# Patient Record
Sex: Female | Born: 1935 | Race: White | Hispanic: No | Marital: Married | Smoking: Never smoker
Health system: Southern US, Community
[De-identification: ages and names within clinical notes are randomized; demographics above are authoritative.]

## PROBLEM LIST (undated history)

## (undated) DIAGNOSIS — C50919 Malignant neoplasm of unspecified site of unspecified female breast: Secondary | ICD-10-CM

## (undated) DIAGNOSIS — D649 Anemia, unspecified: Secondary | ICD-10-CM

## (undated) DIAGNOSIS — Z923 Personal history of irradiation: Secondary | ICD-10-CM

## (undated) DIAGNOSIS — M1990 Unspecified osteoarthritis, unspecified site: Secondary | ICD-10-CM

## (undated) DIAGNOSIS — C4491 Basal cell carcinoma of skin, unspecified: Secondary | ICD-10-CM

## (undated) DIAGNOSIS — E785 Hyperlipidemia, unspecified: Secondary | ICD-10-CM

## (undated) DIAGNOSIS — D0510 Intraductal carcinoma in situ of unspecified breast: Secondary | ICD-10-CM

## (undated) DIAGNOSIS — K900 Celiac disease: Secondary | ICD-10-CM

## (undated) HISTORY — DX: Celiac disease: K90.0

## (undated) HISTORY — PX: TUBAL LIGATION: SHX77

## (undated) HISTORY — DX: Hyperlipidemia, unspecified: E78.5

## (undated) HISTORY — PX: BREAST EXCISIONAL BIOPSY: SUR124

## (undated) HISTORY — DX: Basal cell carcinoma of skin, unspecified: C44.91

## (undated) HISTORY — DX: Intraductal carcinoma in situ of unspecified breast: D05.10

---
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
## 2015-10-10 DIAGNOSIS — E782 Mixed hyperlipidemia: Secondary | ICD-10-CM | POA: Insufficient documentation

## 2015-10-10 DIAGNOSIS — K900 Celiac disease: Secondary | ICD-10-CM | POA: Insufficient documentation

## 2016-06-15 ENCOUNTER — Other Ambulatory Visit: Payer: Self-pay | Admitting: Internal Medicine

## 2016-06-15 DIAGNOSIS — Z1231 Encounter for screening mammogram for malignant neoplasm of breast: Secondary | ICD-10-CM

## 2016-06-24 ENCOUNTER — Ambulatory Visit
Admission: RE | Admit: 2016-06-24 | Discharge: 2016-06-24 | Disposition: A | Discharge status: Home / Self Care | Payer: Medicare PPO | Source: Ambulatory Visit | Attending: Internal Medicine | Admitting: Internal Medicine

## 2016-06-24 ENCOUNTER — Encounter: Payer: Self-pay | Admitting: Radiology

## 2016-06-24 DIAGNOSIS — Z1231 Encounter for screening mammogram for malignant neoplasm of breast: Secondary | ICD-10-CM | POA: Insufficient documentation

## 2016-06-29 ENCOUNTER — Other Ambulatory Visit: Payer: Self-pay | Admitting: *Deleted

## 2016-06-29 ENCOUNTER — Inpatient Hospital Stay
Admission: RE | Admit: 2016-06-29 | Discharge: 2016-06-29 | Disposition: A | Discharge status: Home / Self Care | Payer: Self-pay | Source: Ambulatory Visit | Attending: *Deleted | Admitting: *Deleted

## 2016-06-29 DIAGNOSIS — Z9289 Personal history of other medical treatment: Secondary | ICD-10-CM

## 2016-10-11 DIAGNOSIS — Z8249 Family history of ischemic heart disease and other diseases of the circulatory system: Secondary | ICD-10-CM | POA: Insufficient documentation

## 2016-10-11 DIAGNOSIS — E538 Deficiency of other specified B group vitamins: Secondary | ICD-10-CM | POA: Insufficient documentation

## 2017-04-05 DIAGNOSIS — C50919 Malignant neoplasm of unspecified site of unspecified female breast: Secondary | ICD-10-CM

## 2017-04-05 HISTORY — DX: Malignant neoplasm of unspecified site of unspecified female breast: C50.919

## 2017-05-17 ENCOUNTER — Other Ambulatory Visit: Payer: Self-pay | Admitting: Internal Medicine

## 2017-05-17 DIAGNOSIS — Z1231 Encounter for screening mammogram for malignant neoplasm of breast: Secondary | ICD-10-CM

## 2017-06-27 ENCOUNTER — Ambulatory Visit
Admission: RE | Admit: 2017-06-27 | Discharge: 2017-06-27 | Disposition: A | Discharge status: Home / Self Care | Payer: Medicare PPO | Source: Ambulatory Visit | Attending: Internal Medicine | Admitting: Internal Medicine

## 2017-06-27 DIAGNOSIS — R928 Other abnormal and inconclusive findings on diagnostic imaging of breast: Secondary | ICD-10-CM | POA: Insufficient documentation

## 2017-06-27 DIAGNOSIS — Z1231 Encounter for screening mammogram for malignant neoplasm of breast: Secondary | ICD-10-CM | POA: Insufficient documentation

## 2017-06-29 ENCOUNTER — Other Ambulatory Visit: Payer: Self-pay | Admitting: Internal Medicine

## 2017-06-29 DIAGNOSIS — R928 Other abnormal and inconclusive findings on diagnostic imaging of breast: Secondary | ICD-10-CM

## 2017-06-29 DIAGNOSIS — R921 Mammographic calcification found on diagnostic imaging of breast: Secondary | ICD-10-CM

## 2017-07-11 ENCOUNTER — Ambulatory Visit
Admission: RE | Admit: 2017-07-11 | Discharge: 2017-07-11 | Disposition: A | Discharge status: Home / Self Care | Payer: Medicare PPO | Source: Ambulatory Visit | Attending: Internal Medicine | Admitting: Internal Medicine

## 2017-07-11 DIAGNOSIS — R928 Other abnormal and inconclusive findings on diagnostic imaging of breast: Secondary | ICD-10-CM

## 2017-07-11 DIAGNOSIS — R921 Mammographic calcification found on diagnostic imaging of breast: Secondary | ICD-10-CM

## 2017-12-19 ENCOUNTER — Other Ambulatory Visit: Payer: Self-pay | Admitting: Internal Medicine

## 2017-12-19 DIAGNOSIS — R921 Mammographic calcification found on diagnostic imaging of breast: Secondary | ICD-10-CM

## 2018-01-13 ENCOUNTER — Ambulatory Visit
Admission: RE | Admit: 2018-01-13 | Discharge: 2018-01-13 | Disposition: A | Discharge status: Home / Self Care | Payer: Medicare PPO | Source: Ambulatory Visit | Attending: Internal Medicine | Admitting: Internal Medicine

## 2018-01-13 DIAGNOSIS — R921 Mammographic calcification found on diagnostic imaging of breast: Secondary | ICD-10-CM

## 2018-01-17 ENCOUNTER — Other Ambulatory Visit: Payer: Self-pay | Admitting: Internal Medicine

## 2018-01-17 DIAGNOSIS — R921 Mammographic calcification found on diagnostic imaging of breast: Secondary | ICD-10-CM

## 2018-01-17 DIAGNOSIS — R928 Other abnormal and inconclusive findings on diagnostic imaging of breast: Secondary | ICD-10-CM

## 2018-01-23 ENCOUNTER — Ambulatory Visit
Admission: RE | Admit: 2018-01-23 | Discharge: 2018-01-23 | Disposition: A | Discharge status: Home / Self Care | Payer: Medicare PPO | Source: Ambulatory Visit | Attending: Internal Medicine | Admitting: Internal Medicine

## 2018-01-23 DIAGNOSIS — R928 Other abnormal and inconclusive findings on diagnostic imaging of breast: Secondary | ICD-10-CM

## 2018-01-23 DIAGNOSIS — D0510 Intraductal carcinoma in situ of unspecified breast: Secondary | ICD-10-CM

## 2018-01-23 DIAGNOSIS — R921 Mammographic calcification found on diagnostic imaging of breast: Secondary | ICD-10-CM

## 2018-01-23 HISTORY — PX: BREAST BIOPSY: SHX20

## 2018-01-23 HISTORY — DX: Intraductal carcinoma in situ of unspecified breast: D05.10

## 2018-01-26 ENCOUNTER — Other Ambulatory Visit: Payer: Self-pay | Admitting: *Deleted

## 2018-01-26 DIAGNOSIS — D0511 Intraductal carcinoma in situ of right breast: Secondary | ICD-10-CM | POA: Insufficient documentation

## 2018-01-26 LAB — SURGICAL PATHOLOGY

## 2018-02-01 ENCOUNTER — Inpatient Hospital Stay: Payer: Medicare PPO | Attending: Oncology | Admitting: Oncology

## 2018-02-01 ENCOUNTER — Encounter: Payer: Self-pay | Admitting: Oncology

## 2018-02-01 ENCOUNTER — Other Ambulatory Visit: Payer: Self-pay

## 2018-02-01 ENCOUNTER — Encounter: Payer: Self-pay | Admitting: *Deleted

## 2018-02-01 VITALS — BP 123/69 | HR 76 | Temp 98.1°F | Resp 18 | Ht 64.5 in | Wt 126.6 lb

## 2018-02-01 DIAGNOSIS — E785 Hyperlipidemia, unspecified: Secondary | ICD-10-CM | POA: Diagnosis not present

## 2018-02-01 DIAGNOSIS — Z79899 Other long term (current) drug therapy: Secondary | ICD-10-CM | POA: Diagnosis not present

## 2018-02-01 DIAGNOSIS — K900 Celiac disease: Secondary | ICD-10-CM | POA: Diagnosis not present

## 2018-02-01 DIAGNOSIS — D0511 Intraductal carcinoma in situ of right breast: Secondary | ICD-10-CM | POA: Diagnosis not present

## 2018-02-01 NOTE — Progress Notes (Signed)
Patient here for initial visit. °

## 2018-02-02 ENCOUNTER — Encounter: Payer: Self-pay | Admitting: *Deleted

## 2018-02-03 NOTE — Progress Notes (Signed)
  Oncology Nurse Navigator Documentation  Navigator Location: [facility]Med [facility] (02/03/18 1200) Referral date to RadOnc/MedOnc: 02/02/18 (02/03/18 1200) )Navigator Encounter Type: Initial MedOnc (02/03/18 1200)   Abnormal Finding Date: 01/13/18 (02/03/18 1200) Confirmed Diagnosis Date: 01/25/18 (02/03/18 1200)               Patient Visit Type: MedOnc (02/03/18 1200) Treatment Phase: Pre-Tx/Tx Discussion (02/03/18 1200) Barriers/Navigation Needs: Education (02/03/18 1200) Education: Understanding Cancer/ Treatment Options;Newly Diagnosed Cancer Education (02/03/18 1200) Interventions: Education (02/03/18 1200)        Support Groups/Services: Breast Support Group (02/03/18 1200)             Time Spent with Patient: 30 (02/03/18 1200)   Met with patient and her husband during her initial medical oncology consult with Dr. Tasia Catchings.  Patient has educational literature, "My Breast Cancer Treatment Handbook" by Josephine Igo, RN that she received at her surgical consult.  She is a retired Marine scientist.  Dr. Tasia Catchings discussed participation in the COMET trial.  Patient wants to discuss with Dr. Bary Castilla.  Will follow-up per [facility] protocol.

## 2018-02-05 NOTE — Progress Notes (Signed)
Hematology/Oncology Consult note [facility] Telephone:([phone] [phone]   Patient Care Team: Rusty Aus, MD as PCP - General (Internal Medicine)  REFERRING PROVIDER: Rusty Aus, MD CHIEF COMPLAINTS/REASON FOR VISIT:  Evaluation of breast cancer  HISTORY OF PRESENTING ILLNESS:  Emma Aguirre is a  82 y.o.  female with PMH listed below who was referred to me for evaluation of DCIS.  Patient is a retired Therapist, sports.  Patient had diagnostic mammogram right, on 01/13/2018 to follow up right upper quadrant calcifications.  Mammogram showed right upper quadrant 5m group of calcification, for which stereotactic core needle biopsy was performed.   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    Family history: denies any breast cancer or ovarian cancer OCP use: report remote use of OCP Estrogen and progesterone therapy: endorses history of hormone replacement therapy History of radiation to chest: denies.  Denies any breast pain, nipple discharge, breast skin changes.   Review of Systems  Constitutional: Negative for chills, fever, malaise/fatigue and weight loss.  HENT: Negative for nosebleeds and sore throat.   Eyes: Negative for double vision, photophobia and redness.  Respiratory: Negative for cough, shortness of breath and wheezing.   Cardiovascular: Negative for chest pain, palpitations and orthopnea.  Gastrointestinal: Negative for abdominal pain, blood in stool, nausea and vomiting.  Genitourinary: Negative for dysuria.  Musculoskeletal: Negative for back pain, myalgias and neck pain.  Skin: Negative for itching and rash.  Neurological: Negative for dizziness, tingling and tremors.  Endo/Heme/Allergies: Negative for environmental allergies. Does not bruise/bleed easily.  Psychiatric/Behavioral: Negative for depression.    MEDICAL HISTORY:  Past Medical  History:  Diagnosis Date  . Celiac disease   . Hyperlipidemia     SURGICAL HISTORY: Past Surgical History:  Procedure Laterality Date  . BREAST BIOPSY Right 01/23/2018   stereo calcs path pending x clip  . TUBAL LIGATION      SOCIAL HISTORY: Social History   Socioeconomic History  . Marital status: Married    Spouse name: Not on file  . Number of children: Not on file  . Years of education: Not on file  . Highest education level: Not on file  Occupational History  . Not on file  Social Needs  . Financial resource strain: Not on file  . Food insecurity:    Worry: Not on file    Inability: Not on file  . Transportation needs:    Medical: Not on file    Non-medical: Not on file  Tobacco Use  . Smoking status: Never Smoker  . Smokeless tobacco: Never Used  Substance and Sexual Activity  . Alcohol use: Never    Frequency: Never  . Drug use: Never  . Sexual activity: Not on file  Lifestyle  . Physical activity:    Days per week: Not on file    Minutes per session: Not on file  . Stress: Not on file  Relationships  . Social connections:    Talks on phone: Not on file    Gets together: Not on file    Attends religious service: Not on file    Active member of club or organization: Not on file    Attends meetings of clubs or organizations: Not on file    Relationship status: Not on file  . Intimate partner violence:    Fear of current or ex partner: Not on file    Emotionally abused: Not on file  Physically abused: Not on file    Forced sexual activity: Not on file  Other Topics Concern  . Not on file  Social History Narrative  . Not on file    FAMILY HISTORY: Family History  Problem Relation Age of Onset  . Non-Hodgkin's lymphoma Mother   . Heart attack Father   . Hypertension Brother   . Breast cancer Neg Hx     ALLERGIES:  has No Known Allergies.  MEDICATIONS:  Current Outpatient Medications  Medication Sig Dispense Refill  . Biotin 5 MG TBDP  Take 5,000 mcg by mouth daily.    . Calcium Carb-Cholecalciferol (CALCIUM-VITAMIN D) 600-400 MG-UNIT TABS Take 1 tablet by mouth daily.    . Cholecalciferol (VITAMIN D-1000 MAX ST) 1000 units tablet Take 1,000 Units by mouth daily.    . Magnesium 250 MG TABS Take 250 mg by mouth daily.    . simvastatin (ZOCOR) 40 MG tablet Take 40 mg by mouth daily.    . vitamin B-12 (CYANOCOBALAMIN) 500 MCG tablet Take 500 mcg by mouth daily.    . vitamin C (ASCORBIC ACID) 250 MG tablet Take 250 mg by mouth daily.     No current facility-administered medications for this visit.      PHYSICAL EXAMINATION: ECOG PERFORMANCE STATUS: 0 - Asymptomatic Vitals:   02/01/18 1512  BP: 123/69  Pulse: 76  Resp: 18  Temp: 98.1 F (36.7 C)   Filed Weights   02/01/18 1512  Weight: 126 lb 9.6 oz (57.4 kg)    Physical Exam  Constitutional: She is oriented to person, place, and time. No distress.  HENT:  Head: Normocephalic and atraumatic.  Mouth/Throat: Oropharynx is clear and moist.  Eyes: Pupils are equal, round, and reactive to light. EOM are normal. No scleral icterus.  Neck: Normal range of motion. Neck supple.  Cardiovascular: Normal rate, regular rhythm and normal heart sounds.  Pulmonary/Chest: Effort normal. No respiratory distress. She has no wheezes.  Abdominal: Soft. Bowel sounds are normal. She exhibits no distension and no mass. There is no tenderness.  Musculoskeletal: Normal range of motion. She exhibits no edema or deformity.  Neurological: She is alert and oriented to person, place, and time. No cranial nerve deficit. Coordination normal.  Skin: Skin is warm and dry. No rash noted. No erythema.  Psychiatric: She has a normal mood and affect. Her behavior is normal. Thought content normal.  Breast Exam: no palpable mass bilateral breast. Mild biopsy tissue swelling.   LABORATORY DATA:  I have reviewed the data as listed No results found for: WBC, HGB, HCT, MCV, PLT No results for  input(s): NA, K, CL, CO2, GLUCOSE, BUN, CREATININE, CALCIUM, GFRNONAA, GFRAA, PROT, ALBUMIN, AST, ALT, ALKPHOS, BILITOT, BILIDIR, IBILI in the last 8760 hours. Iron/TIBC/Ferritin/ %Sat No results found for: IRON, TIBC, FERRITIN, IRONPCTSAT   Reviewed patient's lab done at [facility] clinic on 10/07/2017  Cbc showed hemoglobin 12.6, wbc 6.2, platelet 315 CMP showed sodium 142, potassium 4.5, creatinine 0.8, calcium 9.6, AST 20, ALT 16, bilirubin 0.4  ASSESSMENT & PLAN:  1. Ductal carcinoma in situ (DCIS) of right breast    The DCIS diagnosis and care plan were discussed with patient in detail.  We discussed that DCIS is a non invasive breast cancer,  cancer is only in the duct and has not spread into the tissues around it. A substantial number of DCIS lesions will never form a health hazard, particularly if it concerns slow-growing low-risk DCIS (grade I and II). This implies that  many women might be unnecessarily going through intensive treatment resulting in a decrease in quality of life and an increase in health care costs, without any survival benefit. Discussed about COMET trial which is designed to explore the management of low-risk DCIS using an active surveillance (AS) approach  Patient will have 50% chance being radomized to surveillance approach arm or standard management arm.  Her case was discussed on breast tumor board and consensus is reached that she is a candidate for COMET trial.  Patient appreciate discussion and would like to talk to research RN.  She will consider it and further discuss with Dr.Byrnett.   We spent sufficient time to discuss many aspect of care, questions were answered to patient's satisfaction.   All questions were answered. The patient knows to call the clinic with any problems questions or concerns.  Return of visit: to be determined based on patient's decision of trial enrollment and also randomization. Breast Cancer RN Navigator Sheena to coordinate.    Thank you for this kind referral and the opportunity to participate in the care of this patient. A copy of today's note is routed to referring provider  Total face to face encounter time for this patient visit was 70 min. >50% of the time was  spent in counseling and coordination of care.    Earlie Server, MD, PhD Hematology Oncology [facility] at [facility] Pager- [phone] 02/05/2018

## 2018-02-16 ENCOUNTER — Encounter: Payer: Self-pay | Admitting: General Surgery

## 2018-02-16 ENCOUNTER — Other Ambulatory Visit: Payer: Self-pay

## 2018-02-16 ENCOUNTER — Ambulatory Visit: Payer: Medicare PPO | Admitting: General Surgery

## 2018-02-16 ENCOUNTER — Ambulatory Visit: Payer: Self-pay

## 2018-02-16 VITALS — BP 131/79 | HR 66 | Temp 97.7°F | Resp 18 | Ht 64.5 in | Wt 127.0 lb

## 2018-02-16 DIAGNOSIS — D0511 Intraductal carcinoma in situ of right breast: Secondary | ICD-10-CM

## 2018-02-16 NOTE — Progress Notes (Signed)
Patient ID: Emma Aguirre, female   DOB: 04-27-1935, 82 y.o.   MRN: [id number]  Chief Complaint  Patient presents with  . Breast Problem    HPI Emma Aguirre is a 82 y.o. female.  who presents for a breast evaluation referred by Dr Emily Filbert. The most recent mammogram and right breast biopsy was done on 01-23-18, showing DCIS.  She could not feel anything in the breast prior to the mammogram. Patient does perform regular self breast checks and gets regular mammograms done.   She had an appointment with Dr Tasia Catchings 02-01-18, she qualifies for the COMET study. She is here with her husband, Gene. She is retired Equities trader.  HPI  Past Medical History:  Diagnosis Date  . Celiac disease   . DCIS (ductal carcinoma in situ) 01/23/2018   right breast DUCTAL CARCINOMA IN SITU, INTERMEDIATE GRADE, WITHOUT COMEDONECROSIS  . Hyperlipidemia     Past Surgical History:  Procedure Laterality Date  . BREAST BIOPSY Right 01/23/2018   stereo calcs  x clip, DUCTAL CARCINOMA IN SITU, INTERMEDIATE GRADE, WITHOUT COMEDONECROSIS  . TUBAL LIGATION      Family History  Problem Relation Age of Onset  . Non-Hodgkin's lymphoma Mother   . Heart attack Father   . Hypertension Brother   . Breast cancer Neg Hx   . Colon cancer Neg Hx     Social History Social History   Tobacco Use  . Smoking status: Never Smoker  . Smokeless tobacco: Never Used  Substance Use Topics  . Alcohol use: Never    Frequency: Never  . Drug use: Never    No Known Allergies  Current Outpatient Medications  Medication Sig Dispense Refill  . Biotin 5 MG TBDP Take 5,000 mcg by mouth daily.    . Calcium Carb-Cholecalciferol (CALCIUM-VITAMIN D) 600-400 MG-UNIT TABS Take 1 tablet by mouth daily.    . Cholecalciferol (VITAMIN D-1000 MAX ST) 1000 units tablet Take 1,000 Units by mouth daily.    . Magnesium 250 MG TABS Take 250 mg by mouth daily.    . naproxen sodium (ALEVE) 220 MG tablet Take 220 mg by mouth 2 (two) times  daily as needed.    . simvastatin (ZOCOR) 40 MG tablet Take 40 mg by mouth daily.    . vitamin B-12 (CYANOCOBALAMIN) 500 MCG tablet Take 500 mcg by mouth daily.    . vitamin C (ASCORBIC ACID) 250 MG tablet Take 250 mg by mouth daily.     No current facility-administered medications for this visit.     Review of Systems Review of Systems  Constitutional: Negative.   Respiratory: Negative.   Cardiovascular: Negative.     Blood pressure 131/79, pulse 66, temperature 97.7 F (36.5 C), temperature source Skin, resp. rate 18, height 5' 4.5" (1.638 m), weight 127 lb (57.6 kg), SpO2 97 %.  Physical Exam Physical Exam  Constitutional: She is oriented to person, place, and time. She appears well-developed and well-nourished.  HENT:  Mouth/Throat: Oropharynx is clear and moist.  Eyes: Conjunctivae are normal. No scleral icterus.  Neck: Neck supple.  Cardiovascular: Normal rate, regular rhythm and normal heart sounds.  Pulmonary/Chest: Effort normal and breath sounds normal. Right breast exhibits no inverted nipple, no mass, no nipple discharge, no skin change and no tenderness. Left breast exhibits no inverted nipple, no mass, no nipple discharge, no skin change and no tenderness.  Lymphadenopathy:    She has no cervical adenopathy.    She has no axillary adenopathy.  Neurological: She  is alert and oriented to person, place, and time.  Skin: Skin is warm and dry.  Psychiatric: Her behavior is normal.    Data Reviewed  Mammograms of the right breast completed on January 13, 2018 were reviewed.  A 6 mm area of microcalcifications were noted.  Bilateral mammograms of July 11, 2017 showed a area of microcalcifications thought to be benign for which a six-month follow-up was recommended.  The left breast was unremarkable.  A. BREAST CALCIFICATIONS, RIGHT UPPER OUTER QUADRANT; STEREOTACTIC  BIOPSY:  - DUCTAL CARCINOMA IN SITU, INTERMEDIATE GRADE, WITHOUT COMEDONECROSIS.  - CALCIFICATIONS  ASSOCIATED WITH DCIS AND BLOOD VESSELS.   Comment:  DCIS is present in 3 of 4 blocks with the largest linear focus measuring  5 mm. Immunohistochemistry for ER and PR will be performed on block A1  and reported in an addendum.   ADDENDUM:  BREAST BIOMARKER TESTS  Estrogen Receptor (ER) Status: POSITIVE    Percentage of cells with nuclear positivity: > 90%    Average intensity of staining: Strong   Progesterone Receptor (PgR) Status: POSITIVE    Percentage of cells with nuclear positivity: 51-90%    Average intensity of staining: Moderate   February 02, 2017 bone density: REFERRING MD:M.F. MILLER  TECHNICIAN:Lori ARobinson HISTORY:BASELINE STUDY. 82 YEAR OLD WHITE FEMALE.  SITE DATE BMD g/cm2  T-SCORE Z-SCORE g/cm2 CHANGE % CHANGESTATISTICALLY SIGNIFICANT?  LUMBAR SPINE L1- L4   HIP L.FEM.NECK 02/01/17 0.632 -2.0     L.TOTAL HIP 02/01/17 0.825 -1.0    FOREARM L/R 33% 02/01/17 0.554 -2.3     INTERPRETATION: Lumbar spine not reported due to scoliosis or sclerosis.   Osteopenia/Low bone mass .   Assessment    Intermediate grade DCIS    Plan    The majority of the 45-minute visit was devoted to review of therapy options.  These include 1) standard treatment with wide excision, postoperative radiation therapy and antiestrogen therapy versus 2) up and coming therapy of wide excision followed by antiestrogen therapy alone based on age versus 3) participation in the Comet trial with potential randomization versus 4) management with antiestrogen therapy alone.  Details of all of the above were reviewed.  #4 would be my at least preferred as it does not advance any knowledge regarding future treatment options for other women.  The patient had previously met with Dr. Tasia Catchings from medical oncology.  The patient is somewhat bit weeks in  between of how she would like to proceed.  She is decided that she is going to defer therapy until after the holidays which I think is very reasonable considering this tumor.  She is been encouraged to call should she have any questions.  We touched briefly on the potential for the institution of antiestrogen therapy.  I think it is unlikely that she will consider radiation treatment, and is not unreasonable to consider an antiestrogen as neoadjuvant hormonal therapy.  As the patient is already osteopenic consideration could be given towards use of tamoxifen.  It does have a 1% risk of DVT.  The patient does have an intact uterus with a low risk, 1%, developing uterine malignancy.  The improved bone structure would certainly be a plus.  Letrozole or another aromatase inhibitor could be administered although it might aggravate her bone loss, albeit slowly.   .        HPI, Physical Exam, Assessment and Plan have been scribed under the direction and in the presence of Robert Bellow, MD. Karie Fetch, RN  I have completed the exam and reviewed the above documentation for accuracy and completeness.  I agree with the above.  Haematologist has been used and any errors in dictation or transcription are unintentional.  Hervey Ard, M.D., F.A.C.S.   Forest Gleason Jamina Macbeth 02/20/2018, 7:56 PM

## 2018-02-16 NOTE — Patient Instructions (Signed)
The patient is aware to call back for any questions or concerns.  

## 2018-02-20 ENCOUNTER — Telehealth: Payer: Self-pay | Admitting: *Deleted

## 2018-02-20 NOTE — Telephone Encounter (Signed)
Patient called and stated that she saw Dr.Byrnett last week and forgot to ask him some questions regarding the hormone therapy medication that she can take until she has surgery in january. She wanted to know the name of medication and will that be safe to take up until the surgery.

## 2018-02-22 NOTE — Telephone Encounter (Signed)
-----   Message from Robert Bellow, MD sent at 02/21/2018  5:52 PM EST ----- Patient seen last week re: DCIS. Had discussed starting anti-estrogen treatment between now and January when she may have surgery. Would defer starting any medication at this time.  No risk with 2 month delay. Thanks.

## 2018-02-22 NOTE — Telephone Encounter (Signed)
Notified patient as instructed,surgery questions answered, asking how will we know about clear margins, and a date for surgery, can medication be for 90 day supply, etc. apprecaites call. Appointment

## 2018-04-11 ENCOUNTER — Ambulatory Visit: Payer: Medicare PPO | Admitting: General Surgery

## 2018-04-17 ENCOUNTER — Telehealth: Payer: Self-pay | Admitting: General Surgery

## 2018-04-17 NOTE — Telephone Encounter (Signed)
Spoke with the patients letting her know we would need to r/s her appointment with Dr. Bary Castilla on 04/25/2018. Patient stated she did not want to see another provider and would like to see Dr. Bary Castilla I did let the patient know we were not sure of a return date as of right now and would call her back to let her know. Patient stated she was going to contact her pcp

## 2018-04-20 ENCOUNTER — Other Ambulatory Visit: Payer: Self-pay | Admitting: Surgery

## 2018-04-20 ENCOUNTER — Ambulatory Visit: Payer: Self-pay | Admitting: Surgery

## 2018-04-20 DIAGNOSIS — D0511 Intraductal carcinoma in situ of right breast: Secondary | ICD-10-CM

## 2018-04-20 NOTE — H&P (Signed)
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 female who was referred by Yevonne Pax, MD for evaluation of above. Change was noted on last screening mammogram. Patient does not routinely do self breast exams.  Patient has not noted a change on breast exam.  Age of menopause was 8.    Patient denies nipple discharge. Patient denies previous breast biopsy. Patient denies a personal history of breast cancer.   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  Past Surgical History:  has a past surgical history that includes btl and Tubal ligation (1974).  Family History: family history includes Coronary Artery Disease (Blocked arteries around heart) in her father; Lymphoma in her mother.  Social History:  reports that she has never smoked. She has never used smokeless tobacco. She reports current alcohol use. She reports that she does not use drugs.  Current Medications: has a current medication list which includes the following prescription(s): ascorbic acid (vitamin c), biotin, calcium citrate-vitamin d3, cholecalciferol, magnesium, naproxen sodium, and simvastatin.  Allergies:     Allergies as of 04/19/2018  . (No Known Allergies)    ROS:  A 15 point review of systems was performed and was negative except as noted in HPI   Objective:   BP 122/67   Pulse 75   Temp 36.3 C (97.4 F) (Oral)   Ht 157.5 cm (5' 2" )   Wt 56.7 kg (125 lb)   BMI 22.86 kg/m   Constitutional :  alert, appears stated age, cooperative and no distress  Lymphatics/Throat:  no asymmetry, masses, or scars  Respiratory:  clear to auscultation bilaterally  Cardiovascular:  regular rate and rhythm  Gastrointestinal: soft, non-tender; bowel sounds normal; no masses,  no organomegaly.    Musculoskeletal: Steady gait and movement  Skin: Cool and moist  Psychiatric: Normal affect, non-agitated, not confused  Breast:  Chaperone present for exam.  breasts appear normal, no suspicious masses, no skin or nipple changes or axillary nodes.    LABS:   Reason for Addendum #1: Breast Biomarker Results   SPECIMEN SUBMITTED:  A. Breast, right   CLINICAL HISTORY:  Right breast with group of coarse heterogeneous calcifications in the  upper outer quadrant, posterior depth, measuring 6 mm in greatest  dimension   PRE-OPERATIVE DIAGNOSIS:  DCIS vs FCC   POST-OPERATIVE DIAGNOSIS:  X shaped tissue marker clip deployed      DIAGNOSIS:  A. BREAST CALCIFICATIONS, RIGHT UPPER OUTER QUADRANT; STEREOTACTIC  BIOPSY:  - DUCTAL CARCINOMA IN SITU, INTERMEDIATE GRADE, WITHOUT COMEDONECROSIS.  - CALCIFICATIONS ASSOCIATED WITH DCIS AND BLOOD VESSELS.   Comment:  DCIS is present in 3 of 4 blocks with the largest linear focus measuring  5 mm. Immunohistochemistry for ER and PR will be performed on block A1  and reported in an addendum. These findings were communicated to  [facility] in Dr.Dimitrova-Koutleva's office on 01/25/2018. Read back  procedure was performed   GROSS DESCRIPTION:  A. Labeled: Right breast calcification UOQ  Received: in a formalin-filled Brevera collection device  Accompanying specimen radiograph: Yes  Time/Date in fixative: 3:15 on 01/23/18  Cold ischemic time: 2 minutes  Total fixation time: 6.25 hours  Core pieces: Multiple  Measurement: Aggregate, 7.2 x 1.5 x 0.2 cm  Description / comments: Yellow lobulated fibrofatty  Inked: Green  Entirely submitted in  cassette(s):  1 - section A  2 - section B  3 - section C  4 - remaining tissue    Final Diagnosis performed by Quay Burow, MD.  Electronically signed  01/25/2018 9:09:34AM  The electronic signature indicates that the named Attending Pathologist  has evaluated the specimen  Technical  component performed at [facility], [address], [place],  [place] [address] Lab: [phone] Dir: Rush Farmer, MD, MMM Professional  component performed at [facility], [facility], [place], [place], [place] [address] Lab: [phone] Dir: Dellia Nims.  Rubinas, MD   ADDENDUM:  BREAST BIOMARKER TESTS  Estrogen Receptor (ER) Status: POSITIVE    Percentage of cells with nuclear positivity: > 90%    Average intensity of staining: Strong   Progesterone Receptor (PgR) Status: POSITIVE    Percentage of cells with nuclear positivity: 51-90%    Average intensity of staining: Moderate   Cold Ischemia and Fixation Times: Meet requirements specified in latest  version of the ASCO/CAP guidelines  Testing Performed on Block Number(s): A1   METHODS  Fixative: Formalin  Estrogen Receptor: FDA cleared (Ventana) Primary Antibody: SP1  Progesterone Receptor: FDA cleared (Ventana) Primary Antibody: 1E2  Immunohistochemistry controls worked appropriately. Slides were prepared  by [facility], [facility], [place], and  interpreted by Quay Burow, MD.   This test was developed and its performance characteristics determined  by [facility]. It has not been cleared or approved by the [place] Food and Drug  Administration. The FDA does not require this test to go through  premarket FDA review. This test is used for clinical purposes. It should  not be regarded as investigational or for research. This laboratory is  certified under the Clinical Laboratory Improvement Amendments (CLIA) as  qualified to perform high complexity clinical laboratory testing.         Addendum #1 performed by Quay Burow, MD.  Electronically signed  01/26/2018 4:25:51PM  The electronic signature indicates that the named Attending Pathologist  has evaluated the specimen  Technical component performed at [facility], [address], [place],  [place] [address] Lab:  [phone] Dir: Rush Farmer, MD, MMM Professional  component performed at [facility], [facility], [place], [place],  [address] Lab: [phone] Dir: Dellia Nims.  Rubinas, MD   RADS: CLINICAL DATA: First six-month follow-up for probably benign right breast upper outer quadrant calcifications.  EXAM: DIGITAL DIAGNOSTIC UNILATERAL RIGHT MAMMOGRAM WITH CAD AND TOMO  COMPARISON: Previous exam(s).  ACR Breast Density Category c: The breast tissue is heterogeneously dense, which may obscure small masses.  FINDINGS: Standard and compression magnification views of the right breast demonstrate a group of coarse heterogeneous calcifications in the right breast upper outer quadrant, posterior depth, measuring 6 x 5 x 4 mm. There has been a slight increase in the number of calcifications within the group. Therefore, stereotactic core needle biopsy is recommended. There is no associated mass.  Mammographic images were processed with CAD.  IMPRESSION: Right breast upper outer quadrant 6 mm group of calcifications, for which stereotactic core needle biopsy is recommended.  RECOMMENDATION: Stereotactic core needle biopsy of the right breast.  I have discussed the findings and recommendations with the patient. Results were also provided in writing at the conclusion of the visit. If applicable, a reminder letter will be sent to the patient regarding the next appointment.  BI-RADS CATEGORY 4: Suspicious.   Electronically Signed By: Fidela Salisbury M.D. On: 01/13/2018 11:20  CLINICAL DATA:  Post stereotactic core needle biopsy of right breast calcifications.  EXAM: DIAGNOSTIC RIGHT MAMMOGRAM POST STEREOTACTIC BIOPSY  COMPARISON: Previous exam(s).  FINDINGS: Mammographic images were obtained following stereotactic guided biopsy of the right breast. Two-view mammography demonstrates presence of X shaped marker 7 mm  inferior to the biopsy site. Few residual calcifications remain.  IMPRESSION: Successful placement of X shaped marker post stereotactic core needle biopsy of the right breast.  The X shaped marker is located 7 mm inferior to the biopsy site.  Final Assessment: Post Procedure Mammograms for Marker Placement   Electronically Signed By: Fidela Salisbury M.D.  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 The risks of general anesthetic, if used, includes MI, CVA, sudden death or even reaction to anesthetic medications also discussed.  Typical post-op recovery time and possbility of activity restrictions were also discussed.  Alternatives include continued observation.  Benefits include possible symptom relief, pathologic evaluation, and/or curative excision.   The patient verbalized understanding and all questions were answered to the patient's satisfaction.  D05.11 DCIS-  Will proceed with partial mastectomy(lumpectomy) 19301 to remove tumor.  Unlikely to need lymph node biopsy but will confirm with oncology team and add on to scheduled lumpectomy if final decision is to proceed with lymph node biopsy.  Subsequent treatment after surgery such as radiation and/or hormonal therapy will also be discussed with the the oncology team.  If recommended, will make sure to arrange appointment to followup with their office      Electronically signed by Benjamine Sprague, DO on 04/20/2018 9:56 AM

## 2018-04-20 NOTE — H&P (View-Only) (Signed)
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 female who was referred by Yevonne Pax, MD for evaluation of above. Change was noted on last screening mammogram. Patient does not routinely do self breast exams.  Patient has not noted a change on breast exam.  Age of menopause was 73.    Patient denies nipple discharge. Patient denies previous breast biopsy. Patient denies a personal history of breast cancer.   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  Past Surgical History:  has a past surgical history that includes btl and Tubal ligation (1974).  Family History: family history includes Coronary Artery Disease (Blocked arteries around heart) in her father; Lymphoma in her mother.  Social History:  reports that she has never smoked. She has never used smokeless tobacco. She reports current alcohol use. She reports that she does not use drugs.  Current Medications: has a current medication list which includes the following prescription(s): ascorbic acid (vitamin c), biotin, calcium citrate-vitamin d3, cholecalciferol, magnesium, naproxen sodium, and simvastatin.  Allergies:     Allergies as of 04/19/2018  . (No Known Allergies)    ROS:  A 15 point review of systems was performed and was negative except as noted in HPI   Objective:   BP 122/67   Pulse 75   Temp 36.3 C (97.4 F) (Oral)   Ht 157.5 cm (5' 2" )   Wt 56.7 kg (125 lb)   BMI 22.86 kg/m   Constitutional :  alert, appears stated age, cooperative and no distress  Lymphatics/Throat:  no asymmetry, masses, or scars  Respiratory:  clear to auscultation bilaterally  Cardiovascular:  regular rate and rhythm  Gastrointestinal: soft, non-tender; bowel sounds normal; no masses,  no organomegaly.    Musculoskeletal: Steady gait and movement  Skin: Cool and moist  Psychiatric: Normal affect, non-agitated, not confused  Breast:  Chaperone present for exam.  breasts appear normal, no suspicious masses, no skin or nipple changes or axillary nodes.    LABS:   Reason for Addendum #1: Breast Biomarker Results   SPECIMEN SUBMITTED:  A. Breast, right   CLINICAL HISTORY:  Right breast with group of coarse heterogeneous calcifications in the  upper outer quadrant, posterior depth, measuring 6 mm in greatest  dimension   PRE-OPERATIVE DIAGNOSIS:  DCIS vs FCC   POST-OPERATIVE DIAGNOSIS:  X shaped tissue marker clip deployed      DIAGNOSIS:  A. BREAST CALCIFICATIONS, RIGHT UPPER OUTER QUADRANT; STEREOTACTIC  BIOPSY:  - DUCTAL CARCINOMA IN SITU, INTERMEDIATE GRADE, WITHOUT COMEDONECROSIS.  - CALCIFICATIONS ASSOCIATED WITH DCIS AND BLOOD VESSELS.   Comment:  DCIS is present in 3 of 4 blocks with the largest linear focus measuring  5 mm. Immunohistochemistry for ER and PR will be performed on block A1  and reported in an addendum. These findings were communicated to  [facility] in Dr.Dimitrova-Koutleva's office on 01/25/2018. Read back  procedure was performed   GROSS DESCRIPTION:  A. Labeled: Right breast calcification UOQ  Received: in a formalin-filled Brevera collection device  Accompanying specimen radiograph: Yes  Time/Date in fixative: 3:15 on 01/23/18  Cold ischemic time: 2 minutes  Total fixation time: 6.25 hours  Core pieces: Multiple  Measurement: Aggregate, 7.2 x 1.5 x 0.2 cm  Description / comments: Yellow lobulated fibrofatty  Inked: Green  Entirely submitted in  cassette(s):  1 - section A  2 - section B  3 - section C  4 - remaining tissue    Final Diagnosis performed by Quay Burow, MD.  Electronically signed  01/25/2018 9:09:34AM  The electronic signature indicates that the named Attending Pathologist  has evaluated the specimen  Technical  component performed at [facility], [address] [place],  [place] [address] Lab: [phone] Dir: Rush Farmer, MD, MMM Professional  component performed at [facility], [facility], [place], [place], [place] [address] Lab: [phone] Dir: Dellia Nims.  Rubinas, MD   ADDENDUM:  BREAST BIOMARKER TESTS  Estrogen Receptor (ER) Status: POSITIVE    Percentage of cells with nuclear positivity: > 90%    Average intensity of staining: Strong   Progesterone Receptor (PgR) Status: POSITIVE    Percentage of cells with nuclear positivity: 51-90%    Average intensity of staining: Moderate   Cold Ischemia and Fixation Times: Meet requirements specified in latest  version of the ASCO/CAP guidelines  Testing Performed on Block Number(s): A1   METHODS  Fixative: Formalin  Estrogen Receptor: FDA cleared (Ventana) Primary Antibody: SP1  Progesterone Receptor: FDA cleared (Ventana) Primary Antibody: 1E2  Immunohistochemistry controls worked appropriately. Slides were prepared  by [facility], [facility], [place], and  interpreted by Quay Burow, MD.   This test was developed and its performance characteristics determined  by [facility]. It has not been cleared or approved by the [place] Food and Drug  Administration. The FDA does not require this test to go through  premarket FDA review. This test is used for clinical purposes. It should  not be regarded as investigational or for research. This laboratory is  certified under the Clinical Laboratory Improvement Amendments (CLIA) as  qualified to perform high complexity clinical laboratory testing.         Addendum #1 performed by Quay Burow, MD.  Electronically signed  01/26/2018 4:25:51PM  The electronic signature indicates that the named Attending Pathologist  has evaluated the specimen  Technical component performed at [facility], [address], [place],  [place] [address] Lab:  [phone] Dir: Rush Farmer, MD, MMM Professional  component performed at [facility], [facility], [place], [place],  [address] Lab: [phone] Dir: Dellia Nims.  Rubinas, MD   RADS: CLINICAL DATA: First six-month follow-up for probably benign right breast upper outer quadrant calcifications.  EXAM: DIGITAL DIAGNOSTIC UNILATERAL RIGHT MAMMOGRAM WITH CAD AND TOMO  COMPARISON: Previous exam(s).  ACR Breast Density Category c: The breast tissue is heterogeneously dense, which may obscure small masses.  FINDINGS: Standard and compression magnification views of the right breast demonstrate a group of coarse heterogeneous calcifications in the right breast upper outer quadrant, posterior depth, measuring 6 x 5 x 4 mm. There has been a slight increase in the number of calcifications within the group. Therefore, stereotactic core needle biopsy is recommended. There is no associated mass.  Mammographic images were processed with CAD.  IMPRESSION: Right breast upper outer quadrant 6 mm group of calcifications, for which stereotactic core needle biopsy is recommended.  RECOMMENDATION: Stereotactic core needle biopsy of the right breast.  I have discussed the findings and recommendations with the patient. Results were also provided in writing at the conclusion of the visit. If applicable, a reminder letter will be sent to the patient regarding the next appointment.  BI-RADS CATEGORY 4: Suspicious.   Electronically Signed By: Fidela Salisbury M.D. On: 01/13/2018 11:20  CLINICAL DATA:  Post stereotactic core needle biopsy of right breast calcifications.  EXAM: DIAGNOSTIC RIGHT MAMMOGRAM POST STEREOTACTIC BIOPSY  COMPARISON: Previous exam(s).  FINDINGS: Mammographic images were obtained following stereotactic guided biopsy of the right breast. Two-view mammography demonstrates presence of X shaped marker 7 mm  inferior to the biopsy site. Few residual calcifications remain.  IMPRESSION: Successful placement of X shaped marker post stereotactic core needle biopsy of the right breast.  The X shaped marker is located 7 mm inferior to the biopsy site.  Final Assessment: Post Procedure Mammograms for Marker Placement   Electronically Signed By: Fidela Salisbury M.D.  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 The risks of general anesthetic, if used, includes MI, CVA, sudden death or even reaction to anesthetic medications also discussed.  Typical post-op recovery time and possbility of activity restrictions were also discussed.  Alternatives include continued observation.  Benefits include possible symptom relief, pathologic evaluation, and/or curative excision.   The patient verbalized understanding and all questions were answered to the patient's satisfaction.  D05.11 DCIS-  Will proceed with partial mastectomy(lumpectomy) 19301 to remove tumor.  Unlikely to need lymph node biopsy but will confirm with oncology team and add on to scheduled lumpectomy if final decision is to proceed with lymph node biopsy.  Subsequent treatment after surgery such as radiation and/or hormonal therapy will also be discussed with the the oncology team.  If recommended, will make sure to arrange appointment to followup with their office      Electronically signed by Benjamine Sprague, DO on 04/20/2018 9:56 AM

## 2018-04-25 ENCOUNTER — Ambulatory Visit: Payer: Medicare PPO | Admitting: General Surgery

## 2018-04-27 ENCOUNTER — Encounter
Admission: RE | Admit: 2018-04-27 | Discharge: 2018-04-27 | Disposition: A | Discharge status: Home / Self Care | Payer: Medicare Other | Source: Ambulatory Visit | Attending: Surgery | Admitting: Surgery

## 2018-04-27 ENCOUNTER — Other Ambulatory Visit: Payer: Self-pay

## 2018-04-27 HISTORY — DX: Anemia, unspecified: D64.9

## 2018-04-27 HISTORY — DX: Unspecified osteoarthritis, unspecified site: M19.90

## 2018-04-27 NOTE — Patient Instructions (Signed)
Your procedure is scheduled on: 05-02-18 TUESDAY Report to [place] @ 8:30 AM  Remember: Instructions that are not followed completely may result in serious medical risk, up to and including death, or upon the discretion of your surgeon and anesthesiologist your surgery may need to be rescheduled.    _x___ 1. Do not eat food after midnight the night before your procedure. NO GUM OR CANDY AFTER MIDNIGHT.  You may drink clear liquids up to 2 hours before you are scheduled to arrive at the hospital for your procedure.  Do not drink clear liquids within 2 hours of your scheduled arrival to the hospital.  Clear liquids include  --Water or Apple juice without pulp  --Clear carbohydrate beverage such as ClearFast or Gatorade  --Black Coffee or Clear Tea (No milk, no creamers, do not add anything to the coffee or Tea   ____Ensure clear carbohydrate drink on the way to the hospital for bariatric patients  ____Ensure clear carbohydrate drink 3 hours before surgery for Dr Dwyane Luo patients if physician instructed.    __x__ 2. No Alcohol for 24 hours before or after surgery.   __x__3. No Smoking or e-cigarettes for 24 prior to surgery.  Do not use any chewable tobacco products for at least 6 hour prior to surgery   ____  4. Bring all medications with you on the day of surgery if instructed.    __x__ 5. Notify your doctor if there is any change in your medical condition     (cold, fever, infections).    x___6. On the morning of surgery brush your teeth with toothpaste and water.  You may rinse your mouth with mouth wash if you wish.  Do not swallow any toothpaste or mouthwash.   Do not wear jewelry, make-up, hairpins, clips or nail polish.  Do not wear lotions, powders, or perfumes. You may wear deodorant.  Do not shave 48 hours prior to surgery. Men may shave face and neck.  Do not bring valuables to the hospital.    [facility] is not responsible for any belongings or  valuables.               Contacts, dentures or bridgework may not be worn into surgery.  Leave your suitcase in the car. After surgery it may be brought to your room.  For patients admitted to the hospital, discharge time is determined by your treatment team.  _  Patients discharged the day of surgery will not be allowed to drive home.  You will need someone to drive you home and stay with you the night of your procedure.    Please read over the following fact sheets that you were given:   [facility] Preparing for Surgery  ____ Take anti-hypertensive listed below, cardiac, seizure, asthma, anti-reflux and psychiatric medicines. These include:  1. NONE  2.  3.  4.  5.  6.  ____Fleets enema or Magnesium Citrate as directed.   _x___ Use CHG Soap or sage wipes as directed on instruction sheet   ____ Use inhalers on the day of surgery and bring to hospital day of surgery  ____ Stop Metformin and Janumet 2 days prior to surgery.    ____ Take 1/2 of usual insulin dose the night before surgery and none on the morning surgery.   ____ Follow recommendations from Cardiologist, Pulmonologist or PCP regarding stopping Aspirin, Coumadin, Plavix ,Eliquis, Effient, or Pradaxa, and Pletal.  X____Stop Anti-inflammatories such as Advil, Aleve, Ibuprofen, Motrin, Naproxen, Naprosyn,  Goodies powders or aspirin products NOW-OK to take Tylenol    _x___ Stop supplements until after surgery-STOP VITAMIN C AND BIOTIN NOW-MAY RESUME AFTER SURGERY   ____ Bring C-Pap to the hospital.

## 2018-04-28 ENCOUNTER — Ambulatory Visit
Admission: RE | Admit: 2018-04-28 | Discharge: 2018-04-28 | Disposition: A | Discharge status: Home / Self Care | Payer: Medicare Other | Source: Ambulatory Visit | Attending: Surgery | Admitting: Surgery

## 2018-04-28 DIAGNOSIS — Z01810 Encounter for preprocedural cardiovascular examination: Secondary | ICD-10-CM | POA: Diagnosis not present

## 2018-05-01 MED ORDER — CEFAZOLIN SODIUM-DEXTROSE 2-4 GM/100ML-% IV SOLN
2.0000 g | INTRAVENOUS | Status: AC
  Administered 2018-05-02: 2 g via INTRAVENOUS

## 2018-05-02 ENCOUNTER — Ambulatory Visit
Admission: RE | Admit: 2018-05-02 | Discharge: 2018-05-02 | Disposition: A | Discharge status: Home / Self Care | Payer: Medicare Other | Source: Ambulatory Visit | Attending: Surgery | Admitting: Surgery

## 2018-05-02 ENCOUNTER — Ambulatory Visit: Payer: Medicare Other | Admitting: Anesthesiology

## 2018-05-02 ENCOUNTER — Other Ambulatory Visit: Payer: Self-pay

## 2018-05-02 ENCOUNTER — Encounter: Payer: Self-pay | Admitting: *Deleted

## 2018-05-02 ENCOUNTER — Encounter
Admission: RE | Disposition: A | Discharge status: Home / Self Care | Payer: Self-pay | Source: Ambulatory Visit | Attending: Surgery

## 2018-05-02 ENCOUNTER — Encounter: Payer: Self-pay | Admitting: Anesthesiology

## 2018-05-02 DIAGNOSIS — D0511 Intraductal carcinoma in situ of right breast: Secondary | ICD-10-CM

## 2018-05-02 DIAGNOSIS — E782 Mixed hyperlipidemia: Secondary | ICD-10-CM | POA: Insufficient documentation

## 2018-05-02 DIAGNOSIS — Z79899 Other long term (current) drug therapy: Secondary | ICD-10-CM | POA: Diagnosis not present

## 2018-05-02 HISTORY — PX: BREAST LUMPECTOMY WITH NEEDLE LOCALIZATION AND AXILLARY SENTINEL LYMPH NODE BX: SHX5760

## 2018-05-02 HISTORY — PX: BREAST LUMPECTOMY: SHX2

## 2018-05-02 SURGERY — BREAST LUMPECTOMY WITH NEEDLE LOCALIZATION AND AXILLARY SENTINEL LYMPH NODE BX
Anesthesia: General | Laterality: Right

## 2018-05-02 MED ORDER — LIDOCAINE HCL (PF) 2 % IJ SOLN
INTRAMUSCULAR | Status: AC
  Filled 2018-05-02: qty 10

## 2018-05-02 MED ORDER — EPHEDRINE SULFATE 50 MG/ML IJ SOLN
INTRAMUSCULAR | Status: AC
  Filled 2018-05-02: qty 1

## 2018-05-02 MED ORDER — PROPOFOL 10 MG/ML IV BOLUS
INTRAVENOUS | Status: AC
  Filled 2018-05-02: qty 20

## 2018-05-02 MED ORDER — FENTANYL CITRATE (PF) 100 MCG/2ML IJ SOLN: 25.0000 ug | INTRAMUSCULAR | Status: DC | PRN

## 2018-05-02 MED ORDER — GABAPENTIN 300 MG PO CAPS
300.0000 mg | ORAL_CAPSULE | ORAL | Status: AC
  Administered 2018-05-02: 300 mg via ORAL

## 2018-05-02 MED ORDER — EPHEDRINE SULFATE 50 MG/ML IJ SOLN
INTRAMUSCULAR | Status: DC | PRN
  Administered 2018-05-02: 10 mg via INTRAVENOUS

## 2018-05-02 MED ORDER — IBUPROFEN 800 MG PO TABS: 800.0000 mg | ORAL_TABLET | Freq: Three times a day (TID) | ORAL | 0 refills | Status: DC | PRN

## 2018-05-02 MED ORDER — FENTANYL CITRATE (PF) 100 MCG/2ML IJ SOLN
INTRAMUSCULAR | Status: DC | PRN
  Administered 2018-05-02 (×2): 25 ug via INTRAVENOUS

## 2018-05-02 MED ORDER — DEXAMETHASONE SODIUM PHOSPHATE 10 MG/ML IJ SOLN
INTRAMUSCULAR | Status: DC | PRN
  Administered 2018-05-02: 8 mg via INTRAVENOUS

## 2018-05-02 MED ORDER — BUPIVACAINE HCL (PF) 0.5 % IJ SOLN
INTRAMUSCULAR | Status: AC
  Filled 2018-05-02: qty 30

## 2018-05-02 MED ORDER — CELECOXIB 200 MG PO CAPS
200.0000 mg | ORAL_CAPSULE | ORAL | Status: AC
  Administered 2018-05-02: 200 mg via ORAL

## 2018-05-02 MED ORDER — ONDANSETRON HCL 4 MG/2ML IJ SOLN: 4.0000 mg | Freq: Once | INTRAMUSCULAR | Status: DC | PRN

## 2018-05-02 MED ORDER — MIDAZOLAM HCL 2 MG/2ML IJ SOLN
INTRAMUSCULAR | Status: AC
  Filled 2018-05-02: qty 2

## 2018-05-02 MED ORDER — ACETAMINOPHEN 500 MG PO TABS
ORAL_TABLET | ORAL | Status: AC
  Filled 2018-05-02: qty 2

## 2018-05-02 MED ORDER — ONDANSETRON HCL 4 MG/2ML IJ SOLN
INTRAMUSCULAR | Status: DC | PRN
  Administered 2018-05-02: 4 mg via INTRAVENOUS

## 2018-05-02 MED ORDER — ACETAMINOPHEN 325 MG PO TABS: 650.0000 mg | ORAL_TABLET | Freq: Three times a day (TID) | ORAL | 0 refills | Status: AC | PRN

## 2018-05-02 MED ORDER — MIDAZOLAM HCL 2 MG/2ML IJ SOLN
INTRAMUSCULAR | Status: DC | PRN
  Administered 2018-05-02: 1 mg via INTRAVENOUS

## 2018-05-02 MED ORDER — FAMOTIDINE 20 MG PO TABS
ORAL_TABLET | ORAL | Status: AC
  Filled 2018-05-02: qty 1

## 2018-05-02 MED ORDER — PROPOFOL 10 MG/ML IV BOLUS
INTRAVENOUS | Status: DC | PRN
  Administered 2018-05-02: 90 mg via INTRAVENOUS

## 2018-05-02 MED ORDER — ACETAMINOPHEN 10 MG/ML IV SOLN
INTRAVENOUS | Status: AC
  Filled 2018-05-02: qty 100

## 2018-05-02 MED ORDER — ONDANSETRON HCL 4 MG/2ML IJ SOLN
INTRAMUSCULAR | Status: AC
  Filled 2018-05-02: qty 2

## 2018-05-02 MED ORDER — LIDOCAINE HCL 1 % IJ SOLN
INTRAMUSCULAR | Status: DC | PRN
  Administered 2018-05-02 (×2): 5 mL via SUBCUTANEOUS

## 2018-05-02 MED ORDER — DOCUSATE SODIUM 100 MG PO CAPS: 100.0000 mg | ORAL_CAPSULE | Freq: Two times a day (BID) | ORAL | 0 refills | Status: AC | PRN

## 2018-05-02 MED ORDER — HYDROCODONE-ACETAMINOPHEN 5-325 MG PO TABS: 1.0000 | ORAL_TABLET | Freq: Four times a day (QID) | ORAL | 0 refills | Status: AC | PRN

## 2018-05-02 MED ORDER — FAMOTIDINE 20 MG PO TABS
20.0000 mg | ORAL_TABLET | Freq: Once | ORAL | Status: AC
  Administered 2018-05-02: 20 mg via ORAL

## 2018-05-02 MED ORDER — LIDOCAINE HCL (PF) 1 % IJ SOLN
INTRAMUSCULAR | Status: AC
  Filled 2018-05-02: qty 30

## 2018-05-02 MED ORDER — GABAPENTIN 300 MG PO CAPS
ORAL_CAPSULE | ORAL | Status: AC
  Filled 2018-05-02: qty 1

## 2018-05-02 MED ORDER — CEFAZOLIN SODIUM-DEXTROSE 2-4 GM/100ML-% IV SOLN
INTRAVENOUS | Status: AC
  Filled 2018-05-02: qty 100

## 2018-05-02 MED ORDER — DEXAMETHASONE SODIUM PHOSPHATE 10 MG/ML IJ SOLN
INTRAMUSCULAR | Status: AC
  Filled 2018-05-02: qty 1

## 2018-05-02 MED ORDER — GLYCOPYRROLATE 0.2 MG/ML IJ SOLN
INTRAMUSCULAR | Status: DC | PRN
  Administered 2018-05-02: .1 mg via INTRAVENOUS
  Administered 2018-05-02: 0.1 mg via INTRAVENOUS

## 2018-05-02 MED ORDER — CHLORHEXIDINE GLUCONATE CLOTH 2 % EX PADS: 6.0000 | MEDICATED_PAD | Freq: Once | CUTANEOUS | Status: DC

## 2018-05-02 MED ORDER — LACTATED RINGERS IV SOLN
INTRAVENOUS | Status: DC
  Administered 2018-05-02: 10:00:00 via INTRAVENOUS

## 2018-05-02 MED ORDER — LIDOCAINE HCL (CARDIAC) PF 100 MG/5ML IV SOSY
PREFILLED_SYRINGE | INTRAVENOUS | Status: DC | PRN
  Administered 2018-05-02: 60 mg via INTRAVENOUS

## 2018-05-02 MED ORDER — ACETAMINOPHEN 500 MG PO TABS
1000.0000 mg | ORAL_TABLET | ORAL | Status: AC
  Administered 2018-05-02: 1000 mg via ORAL

## 2018-05-02 MED ORDER — FENTANYL CITRATE (PF) 250 MCG/5ML IJ SOLN
INTRAMUSCULAR | Status: AC
  Filled 2018-05-02: qty 5

## 2018-05-02 MED ORDER — CELECOXIB 200 MG PO CAPS
ORAL_CAPSULE | ORAL | Status: AC
  Filled 2018-05-02: qty 1

## 2018-05-02 SURGICAL SUPPLY — 44 items
APPLIER CLIP 11 MED OPEN (CLIP)
BLADE SURG 15 STRL LF DISP TIS (BLADE) ×1 IMPLANT
BLADE SURG 15 STRL SS (BLADE) ×2
CANISTER SUCT 1200ML W/VALVE (MISCELLANEOUS) ×3 IMPLANT
CHLORAPREP W/TINT 26ML (MISCELLANEOUS) ×3 IMPLANT
CLIP APPLIE 11 MED OPEN (CLIP) IMPLANT
CNTNR SPEC 2.5X3XGRAD LEK (MISCELLANEOUS) ×2
CONT SPEC 4OZ STER OR WHT (MISCELLANEOUS) ×4
CONTAINER SPEC 2.5X3XGRAD LEK (MISCELLANEOUS) ×2 IMPLANT
COVER WAND RF STERILE (DRAPES) ×3 IMPLANT
DERMABOND ADVANCED (GAUZE/BANDAGES/DRESSINGS) ×2
DERMABOND ADVANCED .7 DNX12 (GAUZE/BANDAGES/DRESSINGS) ×1 IMPLANT
DEVICE DUBIN SPECIMEN MAMMOGRA (MISCELLANEOUS) ×3 IMPLANT
DRAPE LAPAROTOMY TRNSV 106X77 (MISCELLANEOUS) ×3 IMPLANT
DRAPE SHEET LG 3/4 BI-LAMINATE (DRAPES) ×3 IMPLANT
ELECT CAUTERY BLADE TIP 2.5 (TIP) ×3
ELECT REM PT RETURN 9FT ADLT (ELECTROSURGICAL) ×3
ELECTRODE CAUTERY BLDE TIP 2.5 (TIP) ×1 IMPLANT
ELECTRODE REM PT RTRN 9FT ADLT (ELECTROSURGICAL) ×1 IMPLANT
GAUZE SPONGE 4X4 12PLY STRL (GAUZE/BANDAGES/DRESSINGS) IMPLANT
GLOVE BIOGEL PI IND STRL 7.0 (GLOVE) ×1 IMPLANT
GLOVE BIOGEL PI INDICATOR 7.0 (GLOVE) ×2
GLOVE SURG SYN 7.0 (GLOVE) ×3 IMPLANT
GOWN STRL REUS W/ TWL LRG LVL3 (GOWN DISPOSABLE) ×2 IMPLANT
GOWN STRL REUS W/TWL LRG LVL3 (GOWN DISPOSABLE) ×4
JACKSON PRATT 10 (INSTRUMENTS) IMPLANT
KIT TURNOVER KIT A (KITS) ×3 IMPLANT
LABEL OR SOLS (LABEL) ×3 IMPLANT
LIGHT WAVEGUIDE WIDE FLAT (MISCELLANEOUS) IMPLANT
NEEDLE HYPO 22GX1.5 SAFETY (NEEDLE) ×3 IMPLANT
PACK BASIN MINOR ARMC (MISCELLANEOUS) ×3 IMPLANT
SLEVE PROBE SENORX GAMMA FIND (MISCELLANEOUS) ×3 IMPLANT
SUT MNCRL 4-0 (SUTURE) ×2
SUT MNCRL 4-0 27XMFL (SUTURE) ×1
SUT SILK 2 0 (SUTURE) ×2
SUT SILK 2-0 30XBRD TIE 12 (SUTURE) ×1 IMPLANT
SUT SILK 3 0 12 30 (SUTURE) ×3 IMPLANT
SUT SILK 3 0 SH 30 (SUTURE) ×3 IMPLANT
SUT VIC AB 3-0 SH 27 (SUTURE) ×2
SUT VIC AB 3-0 SH 27X BRD (SUTURE) ×1 IMPLANT
SUTURE MNCRL 4-0 27XMF (SUTURE) ×1 IMPLANT
SYR 10ML LL (SYRINGE) ×3 IMPLANT
SYR BULB IRRIG 60ML STRL (SYRINGE) ×3 IMPLANT
WATER STERILE IRR 1000ML POUR (IV SOLUTION) ×3 IMPLANT

## 2018-05-02 NOTE — Op Note (Signed)
Preoperative diagnosis:  Right DCIS   Postoperative diagnosis: same.   Procedure: needle-localized right breast partial mastectomy.                   Anesthesia: LMA Surgeon: Dr. Benjamine Sprague  Wound Classification: Clean  Indications: Patient is a 83 y.o. female with a nonpalpable RIGHT breast calcifications noted on mammography with core biopsy demonstrating DCIS requires needle-localized partial mastectomy for treatment   Specimen: Right Breast mass, anterior and posterior margins  Complications: None  Estimated Blood Loss: 20m  Findings: 1. Specimen mammography shows marker and wire on specimen 2. Pathology call refers gross examination of margins was negative, but anterior and posterior margins less than a millimeter from margin 3. No other palpable mass identified.   Description of procedure: Preoperative needle localization was performed by radiology. Localization studies were reviewed. The patient was taken to the operating room and placed supine on the operating table, and after general anesthesia the right breast and axilla were prepped and draped in the usual sterile fashion. A time-out was completed verifying correct patient, procedure, site, positioning, and implant(s) and/or special equipment prior to beginning this procedure.  By comparing the localization studies with the direction and skin entry site of the needle, the probable trajectory and location of the mass was visualized. A skin incision was planned in such a way as to minimize the amount of dissection to reach the mass.  The skin incision was made after infusion of local. Flaps were raised and the location of the wire confirmed. The wire was delivered into the wound. Sharp and blunt dissection was then taken down to the biopsy site within the breast tissue adjacent to the chest wall fascia.  Area of concern on mammogram removed along with the wire and clip, total specimen measuring 4cm x 4cm x 3cm. The specimen and  entire localizing wire were removed. The specimen was oriented with long lateral, short superior, deep double sutures and sent to radiology with the localization studies. Confirmation was received that the entire target lesion had been resected.  Pathology called and stated that the anterior and posterior margins were within a millimeter of the lesion.  Therefore, additional anterior and posterior margins were removed and sent to pathology.  Both margins labeled identically as the original specimen with long lateral short superior, and double deep sutures.  Wound irrigated, hemostasis was achieved and the wound closed in layers with  interrupted sutures of 3-0 Vicryl in deep dermal layer and a running subcuticular suture of Monocryl 4-0, then dressed with dermabond.  The patient tolerated the procedure well and was taken to the postanesthesia care unit in stable condition. Sponge and instrument count correct at end of procedure.

## 2018-05-02 NOTE — Anesthesia Procedure Notes (Signed)
Procedure Name: LMA Insertion Date/Time: 05/02/2018 11:13 AM Performed by: Eben Burow, CRNA Pre-anesthesia Checklist: Patient identified, Emergency Drugs available, Suction available and Patient being monitored Patient Re-evaluated:Patient Re-evaluated prior to induction Oxygen Delivery Method: Circle system utilized Preoxygenation: Pre-oxygenation with 100% oxygen Induction Type: IV induction Ventilation: Mask ventilation without difficulty LMA: LMA inserted LMA Size: 4.0 Number of attempts: 1 Placement Confirmation: positive ETCO2 and breath sounds checked- equal and bilateral Tube secured with: Tape Dental Injury: Teeth and Oropharynx as per pre-operative assessment

## 2018-05-02 NOTE — Anesthesia Postprocedure Evaluation (Signed)
Anesthesia Post Note  Patient: Emma Aguirre  Procedure(s) Performed: BREAST LUMPECTOMY WITH NEEDLE LOCALIZATION AND POSSIBLE SENTINEL NODE BX (Right )  Patient location during evaluation: PACU Anesthesia Type: General Level of consciousness: awake and alert Pain management: pain level controlled Vital Signs Assessment: post-procedure vital signs reviewed and stable Respiratory status: spontaneous breathing, nonlabored ventilation, respiratory function stable and patient connected to nasal cannula oxygen Cardiovascular status: blood pressure returned to baseline and stable Postop Assessment: no apparent nausea or vomiting Anesthetic complications: no     Last Vitals:  Vitals:   05/02/18 1255 05/02/18 1303  BP: 138/65 (!) 141/58  Pulse: 66 65  Resp: 12 18  Temp: 36.7 C (!) 36.1 C  SpO2: 100% 100%    Last Pain:  Vitals:   05/02/18 1303  TempSrc: Temporal  PainSc: 0-No pain                 Delma Drone S

## 2018-05-02 NOTE — Anesthesia Preprocedure Evaluation (Signed)
Anesthesia Evaluation  Patient identified by MRN, date of birth, ID band Patient awake    Reviewed: Allergy & Precautions, NPO status , Patient's Chart, lab work & pertinent test results, reviewed documented beta blocker date and time   Airway Mallampati: II  TM Distance: >3 FB     Dental  (+) Chipped   Pulmonary           Cardiovascular      Neuro/Psych    GI/Hepatic   Endo/Other    Renal/GU      Musculoskeletal  (+) Arthritis ,   Abdominal   Peds  Hematology  (+) anemia ,   Anesthesia Other Findings   Reproductive/Obstetrics                             Anesthesia Physical Anesthesia Plan  ASA: II  Anesthesia Plan: General   Post-op Pain Management:    Induction: Intravenous  PONV Risk Score and Plan:   Airway Management Planned: LMA  Additional Equipment:   Intra-op Plan:   Post-operative Plan:   Informed Consent: I have reviewed the patients History and Physical, chart, labs and discussed the procedure including the risks, benefits and alternatives for the proposed anesthesia with the patient or authorized representative who has indicated his/her understanding and acceptance.       Plan Discussed with: CRNA  Anesthesia Plan Comments:         Anesthesia Quick Evaluation

## 2018-05-02 NOTE — Anesthesia Post-op Follow-up Note (Signed)
Anesthesia QCDR form completed.        

## 2018-05-02 NOTE — Transfer of Care (Signed)
Immediate Anesthesia Transfer of Care Note  Patient: Emma Aguirre  Procedure(s) Performed: BREAST LUMPECTOMY WITH NEEDLE LOCALIZATION AND POSSIBLE SENTINEL NODE BX (Right )  Patient Location: PACU  Anesthesia Type:General  Level of Consciousness: drowsy  Airway & Oxygen Therapy: Patient Spontanous Breathing and Patient connected to face mask oxygen  Post-op Assessment: Report given to RN and Post -op Vital signs reviewed and stable  Post vital signs: Reviewed and stable  Last Vitals:  Vitals Value Taken Time  BP 122/62 05/02/2018 12:25 PM  Temp    Pulse 70 05/02/2018 12:27 PM  Resp 13 05/02/2018 12:27 PM  SpO2 100 % 05/02/2018 12:27 PM  Vitals shown include unvalidated device data.  Last Pain:  Vitals:   05/02/18 0911  TempSrc: Oral  PainSc: 0-No pain         Complications: No apparent anesthesia complications

## 2018-05-02 NOTE — Interval H&P Note (Signed)
History and Physical Interval Note:  05/02/2018 9:19 AM  Emma Aguirre  has presented today for surgery, with the diagnosis of DCIS OF RIGHT BREAST  The various methods of treatment have been discussed with the patient and family. After consideration of risks, benefits and other options for treatment, the patient has consented to  Procedure(s): BREAST LUMPECTOMY WITH NEEDLE LOCALIZATION (Right) as a surgical intervention .  The patient's history has been reviewed, patient examined, no change in status, stable for surgery.  I have reviewed the patient's chart and labs.  Questions were answered to the patient's satisfaction.    Discussed case with oncology and confirmed that patient does NOT require SLNB due to type and size of DCIS noted.  Will proceed with lumpectomy alone  Emma Aguirre

## 2018-05-02 NOTE — Discharge Instructions (Addendum)
AMBULATORY SURGERY  DISCHARGE INSTRUCTIONS   1) The drugs that you were given will stay in your system until tomorrow so for the next 24 hours you should not:  A) Drive an automobile B) Make any legal decisions C) Drink any alcoholic beverage   2) You may resume regular meals tomorrow.  Today it is better to start with liquids and gradually work up to solid foods.  You may eat anything you prefer, but it is better to start with liquids, then soup and crackers, and gradually work up to solid foods.   3) Please notify your doctor immediately if you have any unusual bleeding, trouble breathing, redness and pain at the surgery site, drainage, fever, or pain not relieved by medication.    4) Additional Instructions:Please call for follow up         Please contact your physician with any problems or Same Day Surgery at [phone], Monday through Friday 6 am to 4 pm, or [place] at [facility] number at [phone].

## 2018-05-03 ENCOUNTER — Encounter: Payer: Self-pay | Admitting: Surgery

## 2018-05-04 LAB — SURGICAL PATHOLOGY

## 2018-05-04 NOTE — Addendum Note (Signed)
Addendum  created 05/04/18 1058 by Doreen Salvage, CRNA   Charge Capture section accepted

## 2018-05-16 ENCOUNTER — Encounter: Payer: Self-pay | Admitting: Oncology

## 2018-05-16 ENCOUNTER — Other Ambulatory Visit: Payer: Self-pay

## 2018-05-16 ENCOUNTER — Inpatient Hospital Stay: Payer: Medicare Other | Attending: Oncology | Admitting: Oncology

## 2018-05-16 VITALS — BP 127/80 | HR 70 | Temp 96.7°F | Resp 18 | Wt 123.6 lb

## 2018-05-16 DIAGNOSIS — D0511 Intraductal carcinoma in situ of right breast: Secondary | ICD-10-CM | POA: Diagnosis not present

## 2018-05-16 DIAGNOSIS — M1990 Unspecified osteoarthritis, unspecified site: Secondary | ICD-10-CM | POA: Diagnosis not present

## 2018-05-16 DIAGNOSIS — E785 Hyperlipidemia, unspecified: Secondary | ICD-10-CM | POA: Diagnosis not present

## 2018-05-16 DIAGNOSIS — M8580 Other specified disorders of bone density and structure, unspecified site: Secondary | ICD-10-CM | POA: Diagnosis not present

## 2018-05-16 DIAGNOSIS — Z791 Long term (current) use of non-steroidal anti-inflammatories (NSAID): Secondary | ICD-10-CM

## 2018-05-16 DIAGNOSIS — Z79899 Other long term (current) drug therapy: Secondary | ICD-10-CM

## 2018-05-16 NOTE — Progress Notes (Signed)
Patient here for follow up

## 2018-05-17 NOTE — Progress Notes (Signed)
Hematology/Oncology Consult note [facility] Telephone:([phone] [phone]   Patient Care Team: Rusty Aus, MD as PCP - General (Internal Medicine)  REFERRING PROVIDER: Rusty Aus, MD REASON FOR VISIT:  Follow-up and management for DCIS.  HISTORY OF PRESENTING ILLNESS:  Taquanna Borras is a  83 y.o.  female with PMH listed below who was referred to me for evaluation of DCIS.  Patient is a retired Therapist, sports.  Patient had diagnostic mammogram right, on 01/13/2018 to follow up right upper quadrant calcifications.  Mammogram showed right upper quadrant 62m group of calcification, for which stereotactic core needle biopsy was performed.   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    Family history: denies any breast cancer or ovarian cancer OCP use: report remote use of OCP Estrogen and progesterone therapy: endorses history of hormone replacement therapy History of radiation to chest: denies.  Denies any breast pain, nipple discharge, breast skin changes.  INTERVAL HISTORY JResha Filipponeis a 83y.o. female who has above history reviewed by me today presents for follow up visit for management of DCIS During the interval, patient opted to proceed with standard treatment for DCIS.  She underwent lumpectomy of right breast on 05/02/2018.  Today she presents for follow-up and discuss about pathology results. 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  Clip and biopsy site present.  Background fibrocystic changes.  No evidence of invasive carcinoma.  Size of DCIS at least 5 mm.  Margins uninvolved by DCIS.  Closest margin 6 mm. pTis(DCIS) pNx.  Hormone receptor studies were performed on previous biopsy specimen. ER> 90%, PR 51 to 90%  Today patient has no concern about her breast and surgical wound.  Denies any tenderness, fever or chills, discharge from wound.      Review of Systems  Constitutional: Negative for chills, fever, malaise/fatigue and weight loss.  HENT: Negative for nosebleeds and sore throat.   Eyes: Negative for double vision, photophobia and redness.  Respiratory: Negative for cough, shortness of breath and wheezing.   Cardiovascular: Negative for chest pain, palpitations, orthopnea and leg swelling.  Gastrointestinal: Negative for abdominal pain, blood in stool, nausea and vomiting.  Genitourinary: Negative for dysuria.  Musculoskeletal: Negative for back pain, myalgias and neck pain.  Skin: Negative for itching and rash.  Neurological: Negative for dizziness, tingling and tremors.  Endo/Heme/Allergies: Negative for environmental allergies. Does not bruise/bleed easily.  Psychiatric/Behavioral: Negative for depression and hallucinations.    MEDICAL HISTORY:  Past Medical History:  Diagnosis Date  . Anemia    RELATED TO CELIAC DX  . Arthritis   . Celiac disease   . DCIS (ductal carcinoma in situ) 01/23/2018   right breast DUCTAL CARCINOMA IN SITU, INTERMEDIATE GRADE, WITHOUT COMEDONECROSIS  . Hyperlipidemia     SURGICAL HISTORY: Past Surgical History:  Procedure Laterality Date  . BREAST BIOPSY Right 01/23/2018   stereo calcs  x clip, DUCTAL CARCINOMA IN SITU, INTERMEDIATE GRADE, WITHOUT COMEDONECROSIS  . BREAST LUMPECTOMY Right 05/02/2018  . BREAST LUMPECTOMY WITH NEEDLE LOCALIZATION AND AXILLARY SENTINEL LYMPH NODE BX Right 05/02/2018   Procedure: BREAST LUMPECTOMY WITH NEEDLE LOCALIZATION AND POSSIBLE SENTINEL NODE BX;  Surgeon: SBenjamine Sprague DO;  Location: [facility] ORS;  Service: General;  Laterality: Right;  . TUBAL LIGATION      SOCIAL HISTORY: Social History   Socioeconomic History  . Marital status: Married    Spouse name: Not on file  . Number of children:  Not on file  . Years of education: Not on file  . Highest education level: Not on file  Occupational History  . Not on file  Social Needs  . Financial  resource strain: Not on file  . Food insecurity:    Worry: Not on file    Inability: Not on file  . Transportation needs:    Medical: Not on file    Non-medical: Not on file  Tobacco Use  . Smoking status: Never Smoker  . Smokeless tobacco: Never Used  Substance and Sexual Activity  . Alcohol use: Yes    Frequency: Never    Comment: 1 GLASS WINE A WEEK   . Drug use: Never  . Sexual activity: Not on file  Lifestyle  . Physical activity:    Days per week: Not on file    Minutes per session: Not on file  . Stress: Not on file  Relationships  . Social connections:    Talks on phone: Not on file    Gets together: Not on file    Attends religious service: Not on file    Active member of club or organization: Not on file    Attends meetings of clubs or organizations: Not on file    Relationship status: Not on file  . Intimate partner violence:    Fear of current or ex partner: Not on file    Emotionally abused: Not on file    Physically abused: Not on file    Forced sexual activity: Not on file  Other Topics Concern  . Not on file  Social History Narrative  . Not on file    FAMILY HISTORY: Family History  Problem Relation Age of Onset  . Non-Hodgkin's lymphoma Mother   . Heart attack Father   . Hypertension Brother   . Breast cancer Neg Hx   . Colon cancer Neg Hx     ALLERGIES:  has No Known Allergies.  MEDICATIONS:  Current Outpatient Medications  Medication Sig Dispense Refill  . acetaminophen (TYLENOL) 325 MG tablet Take 2 tablets (650 mg total) by mouth every 8 (eight) hours as needed for up to 30 days for mild pain. 40 tablet 0  . Biotin 5 MG TBDP Take 5,000 mcg by mouth daily.    . Calcium Carb-Cholecalciferol (CALCIUM-VITAMIN D) 600-400 MG-UNIT TABS Take 1 tablet by mouth daily.    . Cholecalciferol (VITAMIN D-1000 MAX ST) 1000 units tablet Take 1,000 Units by mouth daily.    Marland Kitchen ibuprofen (ADVIL,MOTRIN) 800 MG tablet Take 1 tablet (800 mg total) by mouth  every 8 (eight) hours as needed for mild pain or moderate pain. 30 tablet 0  . Magnesium 250 MG TABS Take 250 mg by mouth at bedtime as needed.     . naproxen sodium (ALEVE) 220 MG tablet Take by mouth.    . simvastatin (ZOCOR) 40 MG tablet Take 40 mg by mouth every evening.     . vitamin B-12 (CYANOCOBALAMIN) 500 MCG tablet Take 500 mcg by mouth daily.    . vitamin C (ASCORBIC ACID) 250 MG tablet Take 250 mg by mouth daily.     No current facility-administered medications for this visit.      PHYSICAL EXAMINATION: ECOG PERFORMANCE STATUS: 0 - Asymptomatic Vitals:   05/16/18 1412  BP: 127/80  Pulse: 70  Resp: 18  Temp: (!) 96.7 F (35.9 C)   Filed Weights   05/16/18 1412  Weight: 123 lb 9.6 oz (56.1 kg)  Physical Exam Constitutional:      General: She is not in acute distress. HENT:     Head: Normocephalic and atraumatic.  Eyes:     General: No scleral icterus.    Pupils: Pupils are equal, round, and reactive to light.  Neck:     Musculoskeletal: Normal range of motion and neck supple.  Cardiovascular:     Rate and Rhythm: Normal rate and regular rhythm.     Heart sounds: Normal heart sounds.  Pulmonary:     Effort: Pulmonary effort is normal. No respiratory distress.     Breath sounds: No wheezing.  Abdominal:     General: Bowel sounds are normal. There is no distension.     Palpations: Abdomen is soft. There is no mass.     Tenderness: There is no abdominal tenderness.  Musculoskeletal: Normal range of motion.        General: No deformity.  Skin:    General: Skin is warm and dry.     Findings: No erythema or rash.  Neurological:     Mental Status: She is alert and oriented to person, place, and time.     Cranial Nerves: No cranial nerve deficit.     Coordination: Coordination normal.  Psychiatric:        Behavior: Behavior normal.        Thought Content: Thought content normal.   Breast Exam: Deferred as patient has follow-up appointment with surgery Dr.  Lysle Pearl tomorrow.   LABORATORY DATA:  I have reviewed the data as listed No results found for: WBC, HGB, HCT, MCV, PLT No results for input(s): NA, K, CL, CO2, GLUCOSE, BUN, CREATININE, CALCIUM, GFRNONAA, GFRAA, PROT, ALBUMIN, AST, ALT, ALKPHOS, BILITOT, BILIDIR, IBILI in the last 8760 hours. Iron/TIBC/Ferritin/ %Sat No results found for: IRON, TIBC, FERRITIN, IRONPCTSAT   Reviewed patient's lab done at [facility] clinic on 10/07/2017  Cbc showed hemoglobin 12.6, wbc 6.2, platelet 315 CMP showed sodium 142, potassium 4.5, creatinine 0.8, calcium 9.6, AST 20, ALT 16, bilirubin 0.4  ASSESSMENT & PLAN:  1. Ductal carcinoma in situ (DCIS) of right breast   2. Osteopenia, unspecified location    Pathology was discussed in details with patient. Intermediate grade DCIS, negative margin, no invasive carcinoma was discovered. Discussed with patient about standard treatment recommendation for DCIS. Recommend patient to establish care with radiation oncology for adjuvant radiation. Also discussed with patient that after she finished radiation, I recommend antiestrogen treatment with aromatase inhibitor for 5 years. Rationale of using aromatase inhibitor -Arimidex  discussed with patient.  Side effects of Letrozole including but not limited to hot flush, joint pain, fatigue, mood swing, osteoporosis discussed with patient.  Also recommend baseline bone density study.  Patient reports having bone density studies done every 2 years for osteopenia.  She used to take Fosamax until a few years ago. Last bone density was done on 02/02/2017 showed osteopenia.  Recommend daily calcium and vitamin D supplements.  Patient is extremely anxious and has a lot of questions..We spent sufficient time to discuss many aspect of care, questions were answered to patient's satisfaction.  Patient will decide whether she wants to proceed with radiation or not after talking to radiation oncology.  Advised patient to call me once  she has radiation schedule and I plan to see her a week after she finishes radiation.  We will discuss further details about adjuvant antiestrogen treatments. If she decides not to proceed with radiation, I asked her to let me know and we  will start her on adjuvant antiestrogen treatments.  Total face to face encounter time for this patient visit was 40 min. >50% of the time was  spent in counseling and coordination of care.  RTC to be determined. Earlie Server, MD, PhD Hematology Oncology [facility] at [facility] Pager- [phone] 05/17/2018

## 2018-05-29 ENCOUNTER — Encounter: Payer: Self-pay | Admitting: Radiation Oncology

## 2018-05-29 ENCOUNTER — Ambulatory Visit
Admission: RE | Admit: 2018-05-29 | Discharge: 2018-05-29 | Disposition: A | Discharge status: Home / Self Care | Payer: Medicare Other | Source: Ambulatory Visit | Attending: Radiation Oncology | Admitting: Radiation Oncology

## 2018-05-29 ENCOUNTER — Other Ambulatory Visit: Payer: Self-pay

## 2018-05-29 VITALS — BP 114/59 | HR 77 | Temp 97.1°F | Resp 18 | Wt 125.1 lb

## 2018-05-29 DIAGNOSIS — Z170 Estrogen receptor positive status [ER+]: Secondary | ICD-10-CM | POA: Insufficient documentation

## 2018-05-29 DIAGNOSIS — D0511 Intraductal carcinoma in situ of right breast: Secondary | ICD-10-CM | POA: Diagnosis not present

## 2018-05-29 DIAGNOSIS — Z79899 Other long term (current) drug therapy: Secondary | ICD-10-CM | POA: Diagnosis not present

## 2018-05-29 NOTE — Consult Note (Signed)
NEW PATIENT EVALUATION  Name: Emma Aguirre  MRN: [id number]  Date:   05/29/2018     DOB: Jun 05, 1935   This 83 y.o. 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  REFERRING PHYSICIAN: Rusty Aus, MD  CHIEF COMPLAINT:  Chief Complaint  Patient presents with  . Breast Cancer    Initial consultation of breast cancer    DIAGNOSIS: The encounter diagnosis was Ductal carcinoma in situ (DCIS) of right breast.   PREVIOUS INVESTIGATIONS:  Mammogram and ultrasound reviewed Clinical notes reviewed Surgical pathology reports reviewed  HPI: patient is an 83 year old female who presented with a 6 mm area of calcifications in the right upper outer quadrant of the breast suspicious for malignancy. She underwent stereotactic core biopsy positive for ductal carcinoma in situ.she went on to have a wide local excision showing a 5 mm area of ductal carcinoma in situ overall grade 2 no comedonecrosis. Margins were clear at 6 mm. No lymph nodes were submitted. She had an additional posterior margin excised showing benign mammary tissue. She has done well postoperatively. She specifically denies breast tenderness cough or bone pain.tumor was ER/PR positive will be on antiestrogen therapy after completion of radiation.  PLANNED TREATMENT REGIMEN: hypofractionated right breast radiation  PAST MEDICAL HISTORY:  has a past medical history of Anemia, Arthritis, Celiac disease, DCIS (ductal carcinoma in situ) (01/23/2018), and Hyperlipidemia.    PAST SURGICAL HISTORY:  Past Surgical History:  Procedure Laterality Date  . BREAST BIOPSY Right 01/23/2018   stereo calcs  x clip, DUCTAL CARCINOMA IN SITU, INTERMEDIATE GRADE, WITHOUT COMEDONECROSIS  . BREAST LUMPECTOMY Right 05/02/2018  . BREAST LUMPECTOMY WITH NEEDLE LOCALIZATION AND AXILLARY SENTINEL LYMPH NODE BX Right 05/02/2018   Procedure:  BREAST LUMPECTOMY WITH NEEDLE LOCALIZATION AND POSSIBLE SENTINEL NODE BX;  Surgeon: Benjamine Sprague, DO;  Location: [facility] ORS;  Service: General;  Laterality: Right;  . TUBAL LIGATION      FAMILY HISTORY: family history includes Heart attack in her father; Hypertension in her brother; Non-Hodgkin's lymphoma in her mother.  SOCIAL HISTORY:  reports that she has never smoked. She has never used smokeless tobacco. She reports current alcohol use. She reports that she does not use drugs.  ALLERGIES: Patient has no known allergies.  MEDICATIONS:  Current Outpatient Medications  Medication Sig Dispense Refill  . acetaminophen (TYLENOL) 325 MG tablet Take 2 tablets (650 mg total) by mouth every 8 (eight) hours as needed for up to 30 days for mild pain. 40 tablet 0  . Biotin 5 MG TBDP Take 5,000 mcg by mouth daily.    . Calcium Carb-Cholecalciferol (CALCIUM-VITAMIN D) 600-400 MG-UNIT TABS Take 1 tablet by mouth daily.    . Cholecalciferol (VITAMIN D-1000 MAX ST) 1000 units tablet Take 1,000 Units by mouth daily.    Marland Kitchen ibuprofen (ADVIL,MOTRIN) 800 MG tablet Take 1 tablet (800 mg total) by mouth every 8 (eight) hours as needed for mild pain or moderate pain. 30 tablet 0  . Magnesium 250 MG TABS Take 250 mg by mouth at bedtime as needed.     . naproxen sodium (ALEVE) 220 MG tablet Take by mouth.    . simvastatin (ZOCOR) 40 MG tablet Take 40 mg by mouth every evening.     . vitamin B-12 (CYANOCOBALAMIN) 500 MCG tablet Take 500 mcg by mouth daily.    . vitamin C (ASCORBIC ACID) 250 MG tablet  Take 250 mg by mouth daily.     No current facility-administered medications for this encounter.     ECOG PERFORMANCE STATUS:  0 - Asymptomatic  REVIEW OF SYSTEMS:  Patient denies any weight loss, fatigue, weakness, fever, chills or night sweats. Patient denies any loss of vision, blurred vision. Patient denies any ringing  of the ears or hearing loss. No irregular heartbeat. Patient denies heart murmur or history of  fainting. Patient denies any chest pain or pain radiating to her upper extremities. Patient denies any shortness of breath, difficulty breathing at night, cough or hemoptysis. Patient denies any swelling in the lower legs. Patient denies any nausea vomiting, vomiting of blood, or coffee ground material in the vomitus. Patient denies any stomach pain. Patient states has had normal bowel movements no significant constipation or diarrhea. Patient denies any dysuria, hematuria or significant nocturia. Patient denies any problems walking, swelling in the joints or loss of balance. Patient denies any skin changes, loss of hair or loss of weight. Patient denies any excessive worrying or anxiety or significant depression. Patient denies any problems with insomnia. Patient denies excessive thirst, polyuria, polydipsia. Patient denies any swollen glands, patient denies easy bruising or easy bleeding. Patient denies any recent infections, allergies or URI. Patient "s visual fields have not changed significantly in recent time.    PHYSICAL EXAM: BP (!) 114/59 (BP Location: Left Arm, Patient Position: Sitting)   Pulse 77   Temp (!) 97.1 F (36.2 C) (Tympanic)   Resp 18   Wt 125 lb 1.8 oz (56.8 kg)   BMI 22.88 kg/m  Right breast is wide local excision scar which is healing well. No dominant mass or nodularity is noted in either breast in 2 positions examined. No axillary or supraclavicular adenopathy is identified.Well-developed well-nourished patient in NAD. HEENT reveals PERLA, EOMI, discs not visualized.  Oral cavity is clear. No oral mucosal lesions are identified. Neck is clear without evidence of cervical or supraclavicular adenopathy. Lungs are clear to A&P. Cardiac examination is essentially unremarkable with regular rate and rhythm without murmur rub or thrill. Abdomen is benign with no organomegaly or masses noted. Motor sensory and DTR levels are equal and symmetric in the upper and lower extremities.  Cranial nerves II through XII are grossly intact. Proprioception is intact. No peripheral adenopathy or edema is identified. No motor or sensory levels are noted. Crude visual fields are within normal range.  LABORATORY DATA: pathology reports reviewed    RADIOLOGY RESULTS:mammogram and ultrasound reviewed   IMPRESSION: ductal carcinoma in situ ER/PR positive status post wide local excision of the right breast in 83 year old female  PLAN: present time I have recommended hypofractionated course over 4 weeks to her right breast. Risks and benefits of treatment include a skin reaction fatigue alteration of blood counts possible inclusion of superficial lung all were discussed in detail with the patient. I can forego her scar boost based on the wide clear margins and just treat hypofractionated dose to whole breast.I personally 7 ordered CT simulation for later this week. Patient also will be candidate for antiestrogen therapy after completion of radiation. Patient comprehends my treatment plan well.  I would like to take this opportunity to thank you for allowing me to participate in the care of your patient.Noreene Filbert, MD

## 2018-05-30 NOTE — Telephone Encounter (Signed)
Spoke with patient said she already spoke with someone in our office about she would not need this appointment and that she already had her surgery.

## 2018-05-31 ENCOUNTER — Ambulatory Visit
Admission: RE | Admit: 2018-05-31 | Discharge: 2018-05-31 | Disposition: A | Discharge status: Home / Self Care | Payer: Medicare Other | Source: Ambulatory Visit | Attending: Radiation Oncology | Admitting: Radiation Oncology

## 2018-05-31 DIAGNOSIS — Z510 Encounter for antineoplastic radiation therapy: Secondary | ICD-10-CM | POA: Diagnosis present

## 2018-05-31 DIAGNOSIS — D0511 Intraductal carcinoma in situ of right breast: Secondary | ICD-10-CM | POA: Insufficient documentation

## 2018-06-01 DIAGNOSIS — Z510 Encounter for antineoplastic radiation therapy: Secondary | ICD-10-CM | POA: Diagnosis not present

## 2018-06-02 ENCOUNTER — Other Ambulatory Visit: Payer: Self-pay | Admitting: *Deleted

## 2018-06-02 DIAGNOSIS — D0511 Intraductal carcinoma in situ of right breast: Secondary | ICD-10-CM

## 2018-06-07 ENCOUNTER — Ambulatory Visit
Admission: RE | Admit: 2018-06-07 | Discharge: 2018-06-07 | Disposition: A | Discharge status: Home / Self Care | Payer: Medicare Other | Source: Ambulatory Visit | Attending: Radiation Oncology | Admitting: Radiation Oncology

## 2018-06-07 DIAGNOSIS — Z510 Encounter for antineoplastic radiation therapy: Secondary | ICD-10-CM | POA: Insufficient documentation

## 2018-06-07 DIAGNOSIS — D0511 Intraductal carcinoma in situ of right breast: Secondary | ICD-10-CM | POA: Diagnosis not present

## 2018-06-08 ENCOUNTER — Ambulatory Visit
Admission: RE | Admit: 2018-06-08 | Discharge: 2018-06-08 | Disposition: A | Discharge status: Home / Self Care | Payer: Medicare Other | Source: Ambulatory Visit | Attending: Radiation Oncology | Admitting: Radiation Oncology

## 2018-06-08 DIAGNOSIS — Z510 Encounter for antineoplastic radiation therapy: Secondary | ICD-10-CM | POA: Diagnosis not present

## 2018-06-09 ENCOUNTER — Ambulatory Visit
Admission: RE | Admit: 2018-06-09 | Discharge: 2018-06-09 | Disposition: A | Discharge status: Home / Self Care | Payer: Medicare Other | Source: Ambulatory Visit | Attending: Radiation Oncology | Admitting: Radiation Oncology

## 2018-06-09 DIAGNOSIS — Z510 Encounter for antineoplastic radiation therapy: Secondary | ICD-10-CM | POA: Diagnosis not present

## 2018-06-12 ENCOUNTER — Ambulatory Visit
Admission: RE | Admit: 2018-06-12 | Discharge: 2018-06-12 | Disposition: A | Discharge status: Home / Self Care | Payer: Medicare Other | Source: Ambulatory Visit | Attending: Radiation Oncology | Admitting: Radiation Oncology

## 2018-06-12 DIAGNOSIS — Z510 Encounter for antineoplastic radiation therapy: Secondary | ICD-10-CM | POA: Diagnosis not present

## 2018-06-13 ENCOUNTER — Ambulatory Visit
Admission: RE | Admit: 2018-06-13 | Discharge: 2018-06-13 | Disposition: A | Discharge status: Home / Self Care | Payer: Medicare Other | Source: Ambulatory Visit | Attending: Radiation Oncology | Admitting: Radiation Oncology

## 2018-06-13 DIAGNOSIS — Z510 Encounter for antineoplastic radiation therapy: Secondary | ICD-10-CM | POA: Diagnosis not present

## 2018-06-14 ENCOUNTER — Ambulatory Visit
Admission: RE | Admit: 2018-06-14 | Discharge: 2018-06-14 | Disposition: A | Discharge status: Home / Self Care | Payer: Medicare Other | Source: Ambulatory Visit | Attending: Radiation Oncology | Admitting: Radiation Oncology

## 2018-06-14 DIAGNOSIS — Z510 Encounter for antineoplastic radiation therapy: Secondary | ICD-10-CM | POA: Diagnosis not present

## 2018-06-15 ENCOUNTER — Ambulatory Visit
Admission: RE | Admit: 2018-06-15 | Discharge: 2018-06-15 | Disposition: A | Discharge status: Home / Self Care | Payer: Medicare Other | Source: Ambulatory Visit | Attending: Radiation Oncology | Admitting: Radiation Oncology

## 2018-06-15 ENCOUNTER — Other Ambulatory Visit: Payer: Self-pay

## 2018-06-15 DIAGNOSIS — Z510 Encounter for antineoplastic radiation therapy: Secondary | ICD-10-CM | POA: Diagnosis not present

## 2018-06-16 ENCOUNTER — Ambulatory Visit
Admission: RE | Admit: 2018-06-16 | Discharge: 2018-06-16 | Disposition: A | Discharge status: Home / Self Care | Payer: Medicare Other | Source: Ambulatory Visit | Attending: Radiation Oncology | Admitting: Radiation Oncology

## 2018-06-16 ENCOUNTER — Other Ambulatory Visit: Payer: Self-pay

## 2018-06-16 DIAGNOSIS — Z510 Encounter for antineoplastic radiation therapy: Secondary | ICD-10-CM | POA: Diagnosis not present

## 2018-06-19 ENCOUNTER — Other Ambulatory Visit: Payer: Self-pay

## 2018-06-19 ENCOUNTER — Ambulatory Visit
Admission: RE | Admit: 2018-06-19 | Discharge: 2018-06-19 | Disposition: A | Discharge status: Home / Self Care | Payer: Medicare Other | Source: Ambulatory Visit | Attending: Radiation Oncology | Admitting: Radiation Oncology

## 2018-06-19 DIAGNOSIS — Z510 Encounter for antineoplastic radiation therapy: Secondary | ICD-10-CM | POA: Diagnosis not present

## 2018-06-20 ENCOUNTER — Ambulatory Visit
Admission: RE | Admit: 2018-06-20 | Discharge: 2018-06-20 | Disposition: A | Discharge status: Home / Self Care | Payer: Medicare Other | Source: Ambulatory Visit | Attending: Radiation Oncology | Admitting: Radiation Oncology

## 2018-06-20 ENCOUNTER — Other Ambulatory Visit: Payer: Self-pay

## 2018-06-20 DIAGNOSIS — Z510 Encounter for antineoplastic radiation therapy: Secondary | ICD-10-CM | POA: Diagnosis not present

## 2018-06-21 ENCOUNTER — Ambulatory Visit
Admission: RE | Admit: 2018-06-21 | Discharge: 2018-06-21 | Disposition: A | Discharge status: Home / Self Care | Payer: Medicare Other | Source: Ambulatory Visit | Attending: Radiation Oncology | Admitting: Radiation Oncology

## 2018-06-21 ENCOUNTER — Other Ambulatory Visit: Payer: Self-pay

## 2018-06-21 DIAGNOSIS — Z510 Encounter for antineoplastic radiation therapy: Secondary | ICD-10-CM | POA: Diagnosis not present

## 2018-06-22 ENCOUNTER — Other Ambulatory Visit: Payer: Self-pay

## 2018-06-22 ENCOUNTER — Ambulatory Visit
Admission: RE | Admit: 2018-06-22 | Discharge: 2018-06-22 | Disposition: A | Discharge status: Home / Self Care | Payer: Medicare Other | Source: Ambulatory Visit | Attending: Radiation Oncology | Admitting: Radiation Oncology

## 2018-06-22 ENCOUNTER — Inpatient Hospital Stay: Payer: Medicare Other | Attending: Radiation Oncology

## 2018-06-22 DIAGNOSIS — Z510 Encounter for antineoplastic radiation therapy: Secondary | ICD-10-CM | POA: Diagnosis not present

## 2018-06-22 DIAGNOSIS — D0511 Intraductal carcinoma in situ of right breast: Secondary | ICD-10-CM | POA: Insufficient documentation

## 2018-06-22 LAB — CBC
HCT: 38.5 % (ref 36.0–46.0)
Hemoglobin: 12.6 g/dL (ref 12.0–15.0)
MCH: 31 pg (ref 26.0–34.0)
MCHC: 32.7 g/dL (ref 30.0–36.0)
MCV: 94.8 fL (ref 80.0–100.0)
Platelets: 280 10*3/uL (ref 150–400)
RBC: 4.06 MIL/uL (ref 3.87–5.11)
RDW: 12.2 % (ref 11.5–15.5)
WBC: 6.2 10*3/uL (ref 4.0–10.5)
nRBC: 0 % (ref 0.0–0.2)

## 2018-06-23 ENCOUNTER — Other Ambulatory Visit: Payer: Self-pay

## 2018-06-23 ENCOUNTER — Ambulatory Visit
Admission: RE | Admit: 2018-06-23 | Discharge: 2018-06-23 | Disposition: A | Discharge status: Home / Self Care | Payer: Medicare Other | Source: Ambulatory Visit | Attending: Radiation Oncology | Admitting: Radiation Oncology

## 2018-06-23 DIAGNOSIS — Z510 Encounter for antineoplastic radiation therapy: Secondary | ICD-10-CM | POA: Diagnosis not present

## 2018-06-25 ENCOUNTER — Other Ambulatory Visit: Payer: Self-pay

## 2018-06-26 ENCOUNTER — Ambulatory Visit
Admission: RE | Admit: 2018-06-26 | Discharge: 2018-06-26 | Disposition: A | Discharge status: Home / Self Care | Payer: Medicare Other | Source: Ambulatory Visit | Attending: Radiation Oncology | Admitting: Radiation Oncology

## 2018-06-26 ENCOUNTER — Other Ambulatory Visit: Payer: Self-pay

## 2018-06-26 DIAGNOSIS — Z510 Encounter for antineoplastic radiation therapy: Secondary | ICD-10-CM | POA: Diagnosis not present

## 2018-06-27 ENCOUNTER — Other Ambulatory Visit: Payer: Self-pay

## 2018-06-27 ENCOUNTER — Ambulatory Visit
Admission: RE | Admit: 2018-06-27 | Discharge: 2018-06-27 | Disposition: A | Discharge status: Home / Self Care | Payer: Medicare Other | Source: Ambulatory Visit | Attending: Radiation Oncology | Admitting: Radiation Oncology

## 2018-06-27 DIAGNOSIS — Z510 Encounter for antineoplastic radiation therapy: Secondary | ICD-10-CM | POA: Diagnosis not present

## 2018-06-28 ENCOUNTER — Other Ambulatory Visit: Payer: Self-pay

## 2018-06-28 ENCOUNTER — Ambulatory Visit
Admission: RE | Admit: 2018-06-28 | Discharge: 2018-06-28 | Disposition: A | Discharge status: Home / Self Care | Payer: Medicare Other | Source: Ambulatory Visit | Attending: Radiation Oncology | Admitting: Radiation Oncology

## 2018-06-28 DIAGNOSIS — Z510 Encounter for antineoplastic radiation therapy: Secondary | ICD-10-CM | POA: Diagnosis not present

## 2018-06-29 ENCOUNTER — Other Ambulatory Visit: Payer: Self-pay

## 2018-06-29 ENCOUNTER — Ambulatory Visit
Admission: RE | Admit: 2018-06-29 | Discharge: 2018-06-29 | Disposition: A | Discharge status: Home / Self Care | Payer: Medicare Other | Source: Ambulatory Visit | Attending: Radiation Oncology | Admitting: Radiation Oncology

## 2018-06-29 DIAGNOSIS — Z510 Encounter for antineoplastic radiation therapy: Secondary | ICD-10-CM | POA: Diagnosis not present

## 2018-07-10 ENCOUNTER — Inpatient Hospital Stay: Payer: Medicare Other

## 2018-07-10 ENCOUNTER — Other Ambulatory Visit: Payer: Self-pay

## 2018-07-10 ENCOUNTER — Inpatient Hospital Stay: Payer: Medicare Other | Attending: Oncology | Admitting: Oncology

## 2018-07-10 ENCOUNTER — Encounter: Payer: Self-pay | Admitting: Oncology

## 2018-07-10 DIAGNOSIS — D0511 Intraductal carcinoma in situ of right breast: Secondary | ICD-10-CM | POA: Diagnosis present

## 2018-07-10 DIAGNOSIS — M1990 Unspecified osteoarthritis, unspecified site: Secondary | ICD-10-CM | POA: Diagnosis not present

## 2018-07-10 DIAGNOSIS — E785 Hyperlipidemia, unspecified: Secondary | ICD-10-CM | POA: Diagnosis not present

## 2018-07-10 DIAGNOSIS — Z791 Long term (current) use of non-steroidal anti-inflammatories (NSAID): Secondary | ICD-10-CM | POA: Insufficient documentation

## 2018-07-10 DIAGNOSIS — Y842 Radiological procedure and radiotherapy as the cause of abnormal reaction of the patient, or of later complication, without mention of misadventure at the time of the procedure: Secondary | ICD-10-CM | POA: Insufficient documentation

## 2018-07-10 DIAGNOSIS — Z923 Personal history of irradiation: Secondary | ICD-10-CM | POA: Diagnosis not present

## 2018-07-10 DIAGNOSIS — L298 Other pruritus: Secondary | ICD-10-CM | POA: Diagnosis not present

## 2018-07-10 DIAGNOSIS — Z79899 Other long term (current) drug therapy: Secondary | ICD-10-CM | POA: Diagnosis not present

## 2018-07-10 DIAGNOSIS — M8580 Other specified disorders of bone density and structure, unspecified site: Secondary | ICD-10-CM

## 2018-07-10 LAB — CBC WITH DIFFERENTIAL/PLATELET
Abs Immature Granulocytes: 0.01 10*3/uL (ref 0.00–0.07)
Basophils Absolute: 0.1 10*3/uL (ref 0.0–0.1)
Basophils Relative: 1 %
Eosinophils Absolute: 0.2 10*3/uL (ref 0.0–0.5)
Eosinophils Relative: 4 %
HCT: 36.4 % (ref 36.0–46.0)
Hemoglobin: 12 g/dL (ref 12.0–15.0)
Immature Granulocytes: 0 %
Lymphocytes Relative: 18 %
Lymphs Abs: 0.8 10*3/uL (ref 0.7–4.0)
MCH: 31.2 pg (ref 26.0–34.0)
MCHC: 33 g/dL (ref 30.0–36.0)
MCV: 94.5 fL (ref 80.0–100.0)
Monocytes Absolute: 0.7 10*3/uL (ref 0.1–1.0)
Monocytes Relative: 15 %
Neutro Abs: 3 10*3/uL (ref 1.7–7.7)
Neutrophils Relative %: 62 %
Platelets: 240 10*3/uL (ref 150–400)
RBC: 3.85 MIL/uL — ABNORMAL LOW (ref 3.87–5.11)
RDW: 12.5 % (ref 11.5–15.5)
WBC: 4.8 10*3/uL (ref 4.0–10.5)
nRBC: 0 % (ref 0.0–0.2)

## 2018-07-10 LAB — COMPREHENSIVE METABOLIC PANEL
ALT: 20 U/L (ref 0–44)
AST: 24 U/L (ref 15–41)
Albumin: 4 g/dL (ref 3.5–5.0)
Alkaline Phosphatase: 54 U/L (ref 38–126)
Anion gap: 6 (ref 5–15)
BUN: 19 mg/dL (ref 8–23)
CO2: 28 mmol/L (ref 22–32)
Calcium: 9.2 mg/dL (ref 8.9–10.3)
Chloride: 104 mmol/L (ref 98–111)
Creatinine, Ser: 0.65 mg/dL (ref 0.44–1.00)
GFR calc Af Amer: >60 mL/min (ref >60)
GFR calc non Af Amer: >60 mL/min (ref >60)
Glucose, Bld: 113 mg/dL — ABNORMAL HIGH (ref 70–99)
Potassium: 4.2 mmol/L (ref 3.5–5.1)
Sodium: 138 mmol/L (ref 135–145)
Total Bilirubin: 0.5 mg/dL (ref 0.3–1.2)
Total Protein: 6.3 g/dL — ABNORMAL LOW (ref 6.5–8.1)

## 2018-07-10 MED ORDER — ANASTROZOLE 1 MG PO TABS: 1.0000 mg | ORAL_TABLET | Freq: Every day | ORAL | 1 refills | Status: DC

## 2018-07-10 NOTE — Progress Notes (Signed)
HEMATOLOGY-ONCOLOGY TeleHEALTH VISIT PROGRESS NOTE  I connected with Emma Aguirre on 07/10/18 at 10:15 AM EDT by telephone and verified that I am speaking with the correct person using two identifiers.  I discussed the limitations, risks, security and privacy concerns of performing an evaluation and management service by telemedicine and the availability of in-person appointments. I also discussed with the patient that there may be a patient responsible charge related to this service. The patient expressed understanding and agreed to proceed.   Other persons participating in the visit and their role in the encounter: Geraldine Solar CMA, for checking in patient.   Patient's location: home  Provider's location: work   Risk analyst Complaint: follow up visit for DCIS management.   History of Present Illness: Emma Aguirre is a  83 y.o.  female with PMH listed below who was referred to me for evaluation of DCIS.  Patient is a retired Therapist, sports.  Patient had diagnostic mammogram right, on 01/13/2018 to follow up right upper quadrant calcifications.  Mammogram showed right upper quadrant 65m group of calcification, for which stereotactic core needle biopsy was performed.   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    Family history: denies any breast cancer or ovarian cancer OCP use: report remote use of OCP Estrogen and progesterone therapy: endorses history of hormone replacement therapy History of radiation to chest: denies.  Denies any breast pain, nipple discharge, breast skin changes.  #Patient was a candidate for DCIS clinical trial.  patient opted to proceed with standard treatment for DCIS.  She underwent lumpectomy of right breast on 05/02/2018.  Today she presents for follow-up and discuss about pathology results. 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  Clip and biopsy site present.  Background  fibrocystic changes.  No evidence of invasive carcinoma.  Size of DCIS at least 5 mm.  Margins uninvolved by DCIS.  Closest margin 6 mm. pTis(DCIS) pNx.  Hormone receptor studies were performed on previous biopsy specimen. ER> 90%, PR 51 to 90%  INTERVAL HISTORY Emma Aguirre a 83y.o. female who has above history reviewed by me today presents for follow up visit for management of DCIS. During the interval, patient has finished adjuvant radiation at the end of March.  Reports tolerating well. She has mild redness and itching from the radiation in the mid chest area.  Not bothering her much. She uses hydrocortisone for itching. Denies any fever, chills, abdominal pain, leg swelling. She does not have any particular concern for her breast today Patient was offered Pneumovax today and declined.  She prefers tele-visit.  Review of Systems  Constitutional: Negative for appetite change, chills, fatigue and fever.  HENT:   Negative for hearing loss and voice change.   Eyes: Negative for eye problems.  Respiratory: Negative for chest tightness and cough.   Cardiovascular: Negative for chest pain.  Gastrointestinal: Negative for abdominal distention, abdominal pain and blood in stool.  Endocrine: Negative for hot flashes.  Genitourinary: Negative for difficulty urinating and frequency.   Musculoskeletal: Negative for arthralgias.  Skin: Positive for itching. Negative for rash.       Focal redness and itchiness around the area of radiation.  Not bothering her.  Neurological: Negative for extremity weakness.  Hematological: Negative for adenopathy.  Psychiatric/Behavioral: Negative for confusion.    Past Medical History:  Diagnosis Date  . Anemia    RELATED TO CELIAC DX  . Arthritis   . Celiac disease   .  DCIS (ductal carcinoma in situ) 01/23/2018   right breast DUCTAL CARCINOMA IN SITU, INTERMEDIATE GRADE, WITHOUT COMEDONECROSIS  . Hyperlipidemia    Past Surgical History:  Procedure  Laterality Date  . BREAST BIOPSY Right 01/23/2018   stereo calcs  x clip, DUCTAL CARCINOMA IN SITU, INTERMEDIATE GRADE, WITHOUT COMEDONECROSIS  . BREAST LUMPECTOMY Right 05/02/2018  . BREAST LUMPECTOMY WITH NEEDLE LOCALIZATION AND AXILLARY SENTINEL LYMPH NODE BX Right 05/02/2018   Procedure: BREAST LUMPECTOMY WITH NEEDLE LOCALIZATION AND POSSIBLE SENTINEL NODE BX;  Surgeon: Benjamine Sprague, DO;  Location: [facility] ORS;  Service: General;  Laterality: Right;  . TUBAL LIGATION      Family History  Problem Relation Age of Onset  . Non-Hodgkin's lymphoma Mother   . Heart attack Father   . Hypertension Brother   . Breast cancer Neg Hx   . Colon cancer Neg Hx     Social History   Socioeconomic History  . Marital status: Married    Spouse name: Not on file  . Number of children: Not on file  . Years of education: Not on file  . Highest education level: Not on file  Occupational History  . Not on file  Social Needs  . Financial resource strain: Not on file  . Food insecurity:    Worry: Not on file    Inability: Not on file  . Transportation needs:    Medical: Not on file    Non-medical: Not on file  Tobacco Use  . Smoking status: Never Smoker  . Smokeless tobacco: Never Used  Substance and Sexual Activity  . Alcohol use: Yes    Frequency: Never    Comment: 1 GLASS WINE A WEEK   . Drug use: Never  . Sexual activity: Not on file  Lifestyle  . Physical activity:    Days per week: Not on file    Minutes per session: Not on file  . Stress: Not on file  Relationships  . Social connections:    Talks on phone: Not on file    Gets together: Not on file    Attends religious service: Not on file    Active member of club or organization: Not on file    Attends meetings of clubs or organizations: Not on file    Relationship status: Not on file  . Intimate partner violence:    Fear of current or ex partner: Not on file    Emotionally abused: Not on file    Physically abused: Not on file     Forced sexual activity: Not on file  Other Topics Concern  . Not on file  Social History Narrative  . Not on file    Current Outpatient Medications on File Prior to Visit  Medication Sig Dispense Refill  . Biotin 5 MG TBDP Take 5,000 mcg by mouth daily.    . Calcium Carb-Cholecalciferol (CALCIUM-VITAMIN D) 600-400 MG-UNIT TABS Take 1 tablet by mouth daily.    . Cholecalciferol (VITAMIN D-1000 MAX ST) 1000 units tablet Take 1,000 Units by mouth daily.    Marland Kitchen ibuprofen (ADVIL,MOTRIN) 800 MG tablet Take 1 tablet (800 mg total) by mouth every 8 (eight) hours as needed for mild pain or moderate pain. 30 tablet 0  . Magnesium 250 MG TABS Take 250 mg by mouth at bedtime as needed.     . naproxen sodium (ALEVE) 220 MG tablet Take by mouth.    . simvastatin (ZOCOR) 40 MG tablet Take 40 mg by mouth every evening.     Marland Kitchen  vitamin B-12 (CYANOCOBALAMIN) 500 MCG tablet Take 500 mcg by mouth daily.    . vitamin C (ASCORBIC ACID) 250 MG tablet Take 250 mg by mouth daily.     No current facility-administered medications on file prior to visit.     No Known Allergies     Observations/Objective: Today's Vitals   07/10/18 0953  PainSc: 0-No pain   There is no height or weight on file to calculate BMI.   CBC    Component Value Date/Time   WBC 4.8 07/10/2018 1138   RBC 3.85 (L) 07/10/2018 1138   HGB 12.0 07/10/2018 1138   HCT 36.4 07/10/2018 1138   PLT 240 07/10/2018 1138   MCV 94.5 07/10/2018 1138   MCH 31.2 07/10/2018 1138   MCHC 33.0 07/10/2018 1138   RDW 12.5 07/10/2018 1138   LYMPHSABS 0.8 07/10/2018 1138   MONOABS 0.7 07/10/2018 1138   EOSABS 0.2 07/10/2018 1138   BASOSABS 0.1 07/10/2018 1138    CMP     Component Value Date/Time   NA 138 07/10/2018 1138   K 4.2 07/10/2018 1138   CL 104 07/10/2018 1138   CO2 28 07/10/2018 1138   GLUCOSE 113 (H) 07/10/2018 1138   BUN 19 07/10/2018 1138   CREATININE 0.65 07/10/2018 1138   CALCIUM 9.2 07/10/2018 1138   PROT 6.3 (L) 07/10/2018  1138   ALBUMIN 4.0 07/10/2018 1138   AST 24 07/10/2018 1138   ALT 20 07/10/2018 1138   ALKPHOS 54 07/10/2018 1138   BILITOT 0.5 07/10/2018 1138   GFRNONAA >60 07/10/2018 1138   GFRAA >60 07/10/2018 1138     Assessment and Plan: 1. Ductal carcinoma in situ (DCIS) of right breast   2. Osteopenia, unspecified location     Status post surgery and adjuvant radiation. Rationale of using aromatase inhibitor -Arimidex discussed with patient.  Side effects of Arimidex including but not limited to hot flush, joint pain, fatigue, mood swing, osteoporosis discussed with patient. Patient voices understanding and willing to proceed.  We will set up lab encounter today to check baseline CBC and a CMP.  #Osteopenia, previously on Fosamax until a few years ago. Will need to repeat DEXA in October 2020. Recommend patient to continue take calcium and vitamin D supplements. Patient is made aware that Arimidex can potentially accelerating bone loss .  We discussed about the rationale of using bisphosphonate or Prolia for osteopenia/long-term aromatase inhibitor use. Recommend patient to obtain dental clearance.  We will rediscuss this at the next visit.    Follow Up Instructions: 6 weeks for lab and MD assessment.   I discussed the assessment and treatment plan with the patient. The patient was provided an opportunity to ask questions and all were answered. The patient agreed with the plan and demonstrated an understanding of the instructions.  The patient was advised to call back or seek an in-person evaluation if the symptoms worsen or if the condition fails to improve as anticipated.   I provided  15 minutes of non face-to-face telephone visit time during this encounter, and > 50% was spent counseling as documented under my assessment & plan.  Earlie Server, MD 07/10/2018 12:54 PM

## 2018-07-10 NOTE — Progress Notes (Signed)
Called patient for Tele-Visit with Dr Tasia Catchings.  Patient c/o of redness and itching from radiation in the mid chest area, not bothersome, uses hydrocortisone for itching.

## 2018-07-17 ENCOUNTER — Telehealth: Payer: Self-pay | Admitting: *Deleted

## 2018-07-17 NOTE — Telephone Encounter (Signed)
Patient called asking about lab results form last week and whether she is to start hormones or not. Lease return her call [phone].  CBC with Differential/Platelet  Order: [id number]  Status:  Final result  Visible to patient:  No (Not Released)  Next appt:  07/26/2018 at 01:00 PM in Radiation Oncology Noreene Filbert, MD)  Dx:  Ductal carcinoma in situ (DCIS) of ri...   Ref Range & Units 7d ago 3wk ago  WBC 4.0 - 10.5 K/uL 4.8  6.2   RBC 3.87 - 5.11 MIL/uL 3.85Low   4.06   Hemoglobin 12.0 - 15.0 g/dL 12.0  12.6   HCT 36.0 - 46.0 % 36.4  38.5   MCV 80.0 - 100.0 fL 94.5  94.8   MCH 26.0 - 34.0 pg 31.2  31.0   MCHC 30.0 - 36.0 g/dL 33.0  32.7   RDW 11.5 - 15.5 % 12.5  12.2   Platelets 150 - 400 K/uL 240  280   nRBC 0.0 - 0.2 % 0.0  0.0 CM  Neutrophils Relative % % 62    Neutro Abs 1.7 - 7.7 K/uL 3.0    Lymphocytes Relative % 18    Lymphs Abs 0.7 - 4.0 K/uL 0.8    Monocytes Relative % 15    Monocytes Absolute 0.1 - 1.0 K/uL 0.7    Eosinophils Relative % 4    Eosinophils Absolute 0.0 - 0.5 K/uL 0.2    Basophils Relative % 1    Basophils Absolute 0.0 - 0.1 K/uL 0.1    Immature Granulocytes % 0    Abs Immature Granulocytes 0.00 - 0.07 K/uL 0.01    Comment: Performed at [facility], [address] [place], [place] [address]  Resulting Agency  [facility]      Specimen Collected: 07/10/18 11:38  Last Resulted: 07/10/18 11:53     Lab Flowsheet    Order Details    View Encounter    Lab and Collection Details    Routing    Result History      CM=Additional comments      Other Results from 07/10/2018   Contains abnormal data Comprehensive metabolic panel  Order: [id number]   Status:  Final result  Visible to patient:  No (Not Released)  Next appt:  07/26/2018 at 01:00 PM in Radiation Oncology Noreene Filbert, MD)  Dx:  Ductal carcinoma in situ (DCIS) of ri...   Ref Range & Units 7d ago  Sodium 135 - 145 mmol/L 138   Potassium 3.5 - 5.1  mmol/L 4.2   Chloride 98 - 111 mmol/L 104   CO2 22 - 32 mmol/L 28   Glucose, Bld 70 - 99 mg/dL 113High    BUN 8 - 23 mg/dL 19   Creatinine, Ser 0.44 - 1.00 mg/dL 0.65   Calcium 8.9 - 10.3 mg/dL 9.2   Total Protein 6.5 - 8.1 g/dL 6.3Low    Albumin 3.5 - 5.0 g/dL 4.0   AST 15 - 41 U/L 24   ALT 0 - 44 U/L 20   Alkaline Phosphatase 38 - 126 U/L 54   Total Bilirubin 0.3 - 1.2 mg/dL 0.5   GFR calc non Af Amer >60 mL/min >60   GFR calc Af Amer >60 mL/min >60   Anion gap 5 - 15 6   Comment: Performed at [facility], [address], [place], [place] [address]  Resulting Agency  [facility] LAB      Specimen Collected:  07/10/18 11:38  Last Resulted: 07/10/18 12:00

## 2018-07-17 NOTE — Telephone Encounter (Signed)
Please let patient know that her labs are good and she can go ahead and start Arimidex. Thanks.

## 2018-07-18 NOTE — Telephone Encounter (Signed)
Returned call to patient and left message on voice mail that her labs are good and ok to start Arimidex

## 2018-07-26 ENCOUNTER — Ambulatory Visit
Admission: RE | Admit: 2018-07-26 | Discharge: 2018-07-26 | Disposition: A | Discharge status: Home / Self Care | Payer: Medicare Other | Source: Ambulatory Visit | Attending: Radiation Oncology | Admitting: Radiation Oncology

## 2018-07-26 ENCOUNTER — Encounter: Payer: Self-pay | Admitting: Radiation Oncology

## 2018-07-26 ENCOUNTER — Other Ambulatory Visit: Payer: Self-pay

## 2018-07-26 VITALS — BP 119/70 | HR 73 | Temp 98.2°F | Resp 18 | Wt 124.2 lb

## 2018-07-26 DIAGNOSIS — Z923 Personal history of irradiation: Secondary | ICD-10-CM | POA: Insufficient documentation

## 2018-07-26 DIAGNOSIS — Z79811 Long term (current) use of aromatase inhibitors: Secondary | ICD-10-CM | POA: Diagnosis not present

## 2018-07-26 DIAGNOSIS — D0511 Intraductal carcinoma in situ of right breast: Secondary | ICD-10-CM | POA: Diagnosis not present

## 2018-07-26 DIAGNOSIS — Z170 Estrogen receptor positive status [ER+]: Secondary | ICD-10-CM | POA: Diagnosis not present

## 2018-07-26 NOTE — Progress Notes (Signed)
Radiation Oncology Follow up Note  Name: Emma Aguirre   Date:   07/26/2018 MRN:  [id number] DOB: 07/21/1935    This 83 y.o. female presents to the clinic today for 1 month follow-up status post whole breast radiation to her right breast for ductal carcinoma in situ.  REFERRING PROVIDER: Rusty Aus, MD  HPI: Patient is a an 83 year old female now about 1 month having completed whole breast radiation to her right breast for ductal carcinoma in situ ER PR positive.  She is seen today in routine follow-up is doing well.  She had a minor skin reaction after radiation which is cleared nicely.  She specifically denies breast tenderness cough or bone pain.  She has been started on.  Arimadex is tolerated that well without side effect.  COMPLICATIONS OF TREATMENT: none  FOLLOW UP COMPLIANCE: keeps appointments   PHYSICAL EXAM:  BP 119/70 (BP Location: Left Arm, Patient Position: Sitting)   Pulse 73   Temp 98.2 F (36.8 C) (Tympanic)   Resp 18   Wt 124 lb 3.7 oz (56.4 kg)   BMI 22.72 kg/m  Lungs are clear to A&P cardiac examination essentially unremarkable with regular rate and rhythm. No dominant mass or nodularity is noted in either breast in 2 positions examined. Incision is well-healed. No axillary or supraclavicular adenopathy is appreciated. Cosmetic result is excellent.  Well-developed well-nourished patient in NAD. HEENT reveals PERLA, EOMI, discs not visualized.  Oral cavity is clear. No oral mucosal lesions are identified. Neck is clear without evidence of cervical or supraclavicular adenopathy. Lungs are clear to A&P. Cardiac examination is essentially unremarkable with regular rate and rhythm without murmur rub or thrill. Abdomen is benign with no organomegaly or masses noted. Motor sensory and DTR levels are equal and symmetric in the upper and lower extremities. Cranial nerves II through XII are grossly intact. Proprioception is intact. No peripheral adenopathy or edema is  identified. No motor or sensory levels are noted. Crude visual fields are within normal range.  RADIOLOGY RESULTS: No current films for review  PLAN: Present time patient is recovering well from her radiation therapy treatments.  I am pleased with her overall progress.  I have asked to see her out for 5 months for follow-up.  She continues on arimadex without side effect.  Patient knows to call with any concerns.  I would like to take this opportunity to thank you for allowing me to participate in the care of your patient.Noreene Filbert, MD

## 2018-08-22 ENCOUNTER — Encounter (INDEPENDENT_AMBULATORY_CARE_PROVIDER_SITE_OTHER): Payer: Self-pay

## 2018-08-22 ENCOUNTER — Other Ambulatory Visit: Payer: Self-pay

## 2018-08-22 ENCOUNTER — Inpatient Hospital Stay: Payer: Medicare Other | Attending: Oncology

## 2018-08-22 DIAGNOSIS — Z807 Family history of other malignant neoplasms of lymphoid, hematopoietic and related tissues: Secondary | ICD-10-CM | POA: Insufficient documentation

## 2018-08-22 DIAGNOSIS — D0511 Intraductal carcinoma in situ of right breast: Secondary | ICD-10-CM | POA: Diagnosis present

## 2018-08-22 DIAGNOSIS — Z79811 Long term (current) use of aromatase inhibitors: Secondary | ICD-10-CM | POA: Insufficient documentation

## 2018-08-22 DIAGNOSIS — M1990 Unspecified osteoarthritis, unspecified site: Secondary | ICD-10-CM | POA: Insufficient documentation

## 2018-08-22 DIAGNOSIS — Z170 Estrogen receptor positive status [ER+]: Secondary | ICD-10-CM | POA: Diagnosis not present

## 2018-08-22 DIAGNOSIS — Z79899 Other long term (current) drug therapy: Secondary | ICD-10-CM | POA: Insufficient documentation

## 2018-08-22 DIAGNOSIS — M8580 Other specified disorders of bone density and structure, unspecified site: Secondary | ICD-10-CM | POA: Insufficient documentation

## 2018-08-22 DIAGNOSIS — Z791 Long term (current) use of non-steroidal anti-inflammatories (NSAID): Secondary | ICD-10-CM | POA: Insufficient documentation

## 2018-08-22 DIAGNOSIS — E785 Hyperlipidemia, unspecified: Secondary | ICD-10-CM | POA: Diagnosis not present

## 2018-08-22 LAB — CBC WITH DIFFERENTIAL/PLATELET
Abs Immature Granulocytes: 0.02 10*3/uL (ref 0.00–0.07)
Basophils Absolute: 0.1 10*3/uL (ref 0.0–0.1)
Basophils Relative: 1 %
Eosinophils Absolute: 0.2 10*3/uL (ref 0.0–0.5)
Eosinophils Relative: 3 %
HCT: 36.9 % (ref 36.0–46.0)
Hemoglobin: 12.1 g/dL (ref 12.0–15.0)
Immature Granulocytes: 0 %
Lymphocytes Relative: 16 %
Lymphs Abs: 1 10*3/uL (ref 0.7–4.0)
MCH: 30.9 pg (ref 26.0–34.0)
MCHC: 32.8 g/dL (ref 30.0–36.0)
MCV: 94.4 fL (ref 80.0–100.0)
Monocytes Absolute: 0.6 10*3/uL (ref 0.1–1.0)
Monocytes Relative: 10 %
Neutro Abs: 4.1 10*3/uL (ref 1.7–7.7)
Neutrophils Relative %: 70 %
Platelets: 264 10*3/uL (ref 150–400)
RBC: 3.91 MIL/uL (ref 3.87–5.11)
RDW: 12.7 % (ref 11.5–15.5)
WBC: 5.8 10*3/uL (ref 4.0–10.5)
nRBC: 0 % (ref 0.0–0.2)

## 2018-08-22 LAB — COMPREHENSIVE METABOLIC PANEL
ALT: 17 U/L (ref 0–44)
AST: 21 U/L (ref 15–41)
Albumin: 4.3 g/dL (ref 3.5–5.0)
Alkaline Phosphatase: 49 U/L (ref 38–126)
Anion gap: 7 (ref 5–15)
BUN: 19 mg/dL (ref 8–23)
CO2: 28 mmol/L (ref 22–32)
Calcium: 9.4 mg/dL (ref 8.9–10.3)
Chloride: 102 mmol/L (ref 98–111)
Creatinine, Ser: 0.87 mg/dL (ref 0.44–1.00)
GFR calc Af Amer: >60 mL/min (ref >60)
GFR calc non Af Amer: >60 mL/min (ref >60)
Glucose, Bld: 99 mg/dL (ref 70–99)
Potassium: 4.1 mmol/L (ref 3.5–5.1)
Sodium: 137 mmol/L (ref 135–145)
Total Bilirubin: 0.6 mg/dL (ref 0.3–1.2)
Total Protein: 6.7 g/dL (ref 6.5–8.1)

## 2018-08-23 ENCOUNTER — Encounter: Payer: Self-pay | Admitting: Oncology

## 2018-08-23 ENCOUNTER — Inpatient Hospital Stay (HOSPITAL_BASED_OUTPATIENT_CLINIC_OR_DEPARTMENT_OTHER): Payer: Medicare Other | Admitting: Oncology

## 2018-08-23 ENCOUNTER — Inpatient Hospital Stay: Payer: Medicare Other

## 2018-08-23 DIAGNOSIS — D0511 Intraductal carcinoma in situ of right breast: Secondary | ICD-10-CM

## 2018-08-23 DIAGNOSIS — M8580 Other specified disorders of bone density and structure, unspecified site: Secondary | ICD-10-CM | POA: Diagnosis not present

## 2018-08-23 MED ORDER — ANASTROZOLE 1 MG PO TABS: 1.0000 mg | ORAL_TABLET | Freq: Every day | ORAL | 5 refills | Status: DC

## 2018-08-23 NOTE — Addendum Note (Signed)
Addended by: Earlie Server on: 08/23/2018 10:33 PM   Modules accepted: Orders

## 2018-08-23 NOTE — Progress Notes (Signed)
HEMATOLOGY-ONCOLOGY TeleHEALTH VISIT PROGRESS NOTE  I connected with Emma Aguirre on 08/23/18 at 10:00 AM EDT by video enabled telemedicine visit and verified that I am speaking with the correct person using two identifiers. I discussed the limitations, risks, security and privacy concerns of performing an evaluation and management service by telemedicine and the availability of in-person appointments. I also discussed with the patient that there may be a patient responsible charge related to this service. The patient expressed understanding and agreed to proceed.   Other persons participating in the visit and their role in the encounter:  Geraldine Solar, [place], check in patient   Janeann Merl, RN, check in patient.   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   INTERVAL HISTORY Emma Aguirre is a 83 y.o. female who has above history reviewed by me today presents for follow up visit for management of assessment for tolerability of aromatase inhibitor treatment for breast cancer. Problems and complaints are listed below:  Patient has DCIS of right breast, candidate for Comet trial.  Patient opted to proceed with standard treatment for DCIS.  Underwent lumpectomy of right breast 05/02/2018.  ER positive.  PR positive.  Status post adjuvant radiation. Has been started on Arimidex 1 mg daily. Today she reports tolerating Arimidex well. She does not feel much of hot flashes or joint pain. Energy level at baseline. No new complaints. Review of Systems  Constitutional: Negative for appetite change, chills, fatigue and fever.  HENT:   Negative for hearing loss and voice change.   Eyes: Negative for eye problems.  Respiratory: Negative for chest tightness and cough.   Cardiovascular: Negative for chest pain.  Gastrointestinal: Negative for abdominal distention, abdominal pain and blood in  stool.  Endocrine: Negative for hot flashes.  Genitourinary: Negative for difficulty urinating and frequency.   Musculoskeletal: Negative for arthralgias.  Skin: Negative for itching and rash.  Neurological: Negative for extremity weakness.  Hematological: Negative for adenopathy.  Psychiatric/Behavioral: Negative for confusion.    Past Medical History:  Diagnosis Date  . Anemia    RELATED TO CELIAC DX  . Arthritis   . Celiac disease   . DCIS (ductal carcinoma in situ) 01/23/2018   right breast DUCTAL CARCINOMA IN SITU, INTERMEDIATE GRADE, WITHOUT COMEDONECROSIS  . Hyperlipidemia    Past Surgical History:  Procedure Laterality Date  . BREAST BIOPSY Right 01/23/2018   stereo calcs  x clip, DUCTAL CARCINOMA IN SITU, INTERMEDIATE GRADE, WITHOUT COMEDONECROSIS  . BREAST LUMPECTOMY Right 05/02/2018  . BREAST LUMPECTOMY WITH NEEDLE LOCALIZATION AND AXILLARY SENTINEL LYMPH NODE BX Right 05/02/2018   Procedure: BREAST LUMPECTOMY WITH NEEDLE LOCALIZATION AND POSSIBLE SENTINEL NODE BX;  Surgeon: Benjamine Sprague, DO;  Location: [facility] ORS;  Service: General;  Laterality: Right;  . TUBAL LIGATION      Family History  Problem Relation Age of Onset  . Non-Hodgkin's lymphoma Mother   . Heart attack Father   . Hypertension Brother   . Breast cancer Neg Hx   . Colon cancer Neg Hx     Social History   Socioeconomic History  . Marital status: Married    Spouse name: Not on file  . Number of children: Not on file  . Years of education: Not on file  . Highest education level: Not on file  Occupational History  . Not on file  Social Needs  . Financial resource strain: Not on file  . Food insecurity:  Worry: Not on file    Inability: Not on file  . Transportation needs:    Medical: Not on file    Non-medical: Not on file  Tobacco Use  . Smoking status: Never Smoker  . Smokeless tobacco: Never Used  Substance and Sexual Activity  . Alcohol use: Yes    Frequency: Never    Comment: 1  GLASS WINE A WEEK   . Drug use: Never  . Sexual activity: Not on file  Lifestyle  . Physical activity:    Days per week: Not on file    Minutes per session: Not on file  . Stress: Not on file  Relationships  . Social connections:    Talks on phone: Not on file    Gets together: Not on file    Attends religious service: Not on file    Active member of club or organization: Not on file    Attends meetings of clubs or organizations: Not on file    Relationship status: Not on file  . Intimate partner violence:    Fear of current or ex partner: Not on file    Emotionally abused: Not on file    Physically abused: Not on file    Forced sexual activity: Not on file  Other Topics Concern  . Not on file  Social History Narrative  . Not on file    Current Outpatient Medications on File Prior to Visit  Medication Sig Dispense Refill  . anastrozole (ARIMIDEX) 1 MG tablet Take 1 tablet (1 mg total) by mouth daily. 90 tablet 1  . Biotin 5 MG TBDP Take 5,000 mcg by mouth daily.    . Calcium Carb-Cholecalciferol (CALCIUM-VITAMIN D) 600-400 MG-UNIT TABS Take 1 tablet by mouth daily.    . Cholecalciferol (VITAMIN D-1000 MAX ST) 1000 units tablet Take 1,000 Units by mouth daily.    Marland Kitchen ibuprofen (ADVIL,MOTRIN) 800 MG tablet Take 1 tablet (800 mg total) by mouth every 8 (eight) hours as needed for mild pain or moderate pain. 30 tablet 0  . losartan-hydrochlorothiazide (HYZAAR) 100-25 MG tablet     . Magnesium 250 MG TABS Take 250 mg by mouth at bedtime as needed.     . naproxen sodium (ALEVE) 220 MG tablet Take by mouth.    . simvastatin (ZOCOR) 40 MG tablet Take 40 mg by mouth every evening.     . vitamin B-12 (CYANOCOBALAMIN) 500 MCG tablet Take 500 mcg by mouth daily.    . vitamin C (ASCORBIC ACID) 250 MG tablet Take 250 mg by mouth daily.    . pravastatin (PRAVACHOL) 20 MG tablet      No current facility-administered medications on file prior to visit.     No Known Allergies      Observations/Objective: Today's Vitals   08/23/18 1015  PainSc: 0-No pain   There is no height or weight on file to calculate BMI.  Physical Exam  Constitutional: She is oriented to person, place, and time. No distress.  HENT:  Head: Normocephalic and atraumatic.  Pulmonary/Chest: Effort normal.  Neurological: She is alert and oriented to person, place, and time.  Psychiatric: Affect normal.    CBC    Component Value Date/Time   WBC 5.8 08/22/2018 1335   RBC 3.91 08/22/2018 1335   HGB 12.1 08/22/2018 1335   HCT 36.9 08/22/2018 1335   PLT 264 08/22/2018 1335   MCV 94.4 08/22/2018 1335   MCH 30.9 08/22/2018 1335   MCHC 32.8 08/22/2018 1335  RDW 12.7 08/22/2018 1335   LYMPHSABS 1.0 08/22/2018 1335   MONOABS 0.6 08/22/2018 1335   EOSABS 0.2 08/22/2018 1335   BASOSABS 0.1 08/22/2018 1335    CMP     Component Value Date/Time   NA 137 08/22/2018 1335   K 4.1 08/22/2018 1335   CL 102 08/22/2018 1335   CO2 28 08/22/2018 1335   GLUCOSE 99 08/22/2018 1335   BUN 19 08/22/2018 1335   CREATININE 0.87 08/22/2018 1335   CALCIUM 9.4 08/22/2018 1335   PROT 6.7 08/22/2018 1335   ALBUMIN 4.3 08/22/2018 1335   AST 21 08/22/2018 1335   ALT 17 08/22/2018 1335   ALKPHOS 49 08/22/2018 1335   BILITOT 0.6 08/22/2018 1335   GFRNONAA >60 08/22/2018 1335   GFRAA >60 08/22/2018 1335     Assessment and Plan: 1. Ductal carcinoma in situ (DCIS) of right breast   2. Osteopenia, unspecified location     DCIS status post surgery and adjuvant radiation. Tolerating Arimidex 1 mg daily. Plan total of 5 years of aromatase inhibitor adjuvantly. Labs reviewed and discussed with patient.  Osteopenia, she has been on Fosamax until a few years ago.  She is due for repeat DEXA in October 2020 will obtain at that time. Recommend patient to continue calcium and vitamin D supplementation. She is made aware that Arimidex can potentially accelerate bone loss.  We discussed the rationale of using  bisphosphonate or Prolia for osteopenia/long-term aromatase inhibitor use.  Awaiting patient to obtain dental clearance.   Follow Up Instructions: 3 months lab MD assessment.   I discussed the assessment and treatment plan with the patient. The patient was provided an opportunity to ask questions and all were answered. The patient agreed with the plan and demonstrated an understanding of the instructions.  The patient was advised to call back or seek an in-person evaluation if the symptoms worsen or if the condition fails to improve as anticipated.   Earlie Server, MD 08/23/2018 9:58 PM

## 2018-08-23 NOTE — Progress Notes (Signed)
Patient contacted for telehealth visit. No concerns voiced.

## 2018-08-24 ENCOUNTER — Telehealth: Payer: Self-pay | Admitting: *Deleted

## 2018-08-24 NOTE — Telephone Encounter (Signed)
What mail order pharmacy, there is not one on her chart.  Please confirm pharmacy and I will send rx.

## 2018-08-24 NOTE — Telephone Encounter (Signed)
Patient confused why prescription was sent to [facility] for her Anastrozole when she 1 gets it from mail order , and 2 just a 90 day supply in April with a refill on it. She requests I call [facility] to cancel prescription sent yesterday which I did and spoke with Audelia Acton and make notation in her chart

## 2018-08-24 NOTE — Telephone Encounter (Signed)
It's done

## 2018-09-06 ENCOUNTER — Other Ambulatory Visit: Payer: Self-pay | Admitting: Surgery

## 2018-09-06 DIAGNOSIS — Z853 Personal history of malignant neoplasm of breast: Secondary | ICD-10-CM

## 2018-09-26 ENCOUNTER — Other Ambulatory Visit: Payer: Self-pay

## 2018-09-26 ENCOUNTER — Ambulatory Visit
Admission: RE | Admit: 2018-09-26 | Discharge: 2018-09-26 | Disposition: A | Discharge status: Home / Self Care | Payer: Medicare Other | Source: Ambulatory Visit | Attending: Surgery | Admitting: Surgery

## 2018-09-26 DIAGNOSIS — Z853 Personal history of malignant neoplasm of breast: Secondary | ICD-10-CM | POA: Diagnosis present

## 2018-09-26 HISTORY — DX: Personal history of irradiation: Z92.3

## 2018-11-21 ENCOUNTER — Ambulatory Visit: Payer: Medicare Other | Admitting: Oncology

## 2018-11-21 ENCOUNTER — Other Ambulatory Visit: Payer: Self-pay | Admitting: Oncology

## 2018-11-21 ENCOUNTER — Other Ambulatory Visit: Payer: Medicare Other

## 2018-11-23 ENCOUNTER — Telehealth: Payer: Self-pay | Admitting: *Deleted

## 2018-11-23 NOTE — Telephone Encounter (Signed)
Patient has an appointment on 8/25 for Lab/ physician, She is asking if she needs labs drawn as her PCP Dr Sabra Heck just drew CBCD/ CMP on 7/29. Please advise

## 2018-11-23 NOTE — Telephone Encounter (Signed)
No need for blood work.thanks.

## 2018-11-24 NOTE — Telephone Encounter (Signed)
Left message on voice mail that lab appointment has been cancelled and that she needs to come  In at 230 to see Dr Tasia Catchings on her scheduled appointment date

## 2018-11-28 ENCOUNTER — Inpatient Hospital Stay: Payer: Medicare Other

## 2018-11-28 ENCOUNTER — Encounter: Payer: Self-pay | Admitting: Oncology

## 2018-11-28 ENCOUNTER — Inpatient Hospital Stay: Payer: Medicare Other | Attending: Oncology | Admitting: Oncology

## 2018-11-28 ENCOUNTER — Other Ambulatory Visit: Payer: Self-pay

## 2018-11-28 VITALS — BP 119/71 | HR 76 | Temp 98.8°F | Resp 16 | Wt 126.6 lb

## 2018-11-28 DIAGNOSIS — E785 Hyperlipidemia, unspecified: Secondary | ICD-10-CM | POA: Insufficient documentation

## 2018-11-28 DIAGNOSIS — D0511 Intraductal carcinoma in situ of right breast: Secondary | ICD-10-CM | POA: Insufficient documentation

## 2018-11-28 DIAGNOSIS — R232 Flushing: Secondary | ICD-10-CM | POA: Diagnosis not present

## 2018-11-28 DIAGNOSIS — Z923 Personal history of irradiation: Secondary | ICD-10-CM | POA: Insufficient documentation

## 2018-11-28 DIAGNOSIS — Z170 Estrogen receptor positive status [ER+]: Secondary | ICD-10-CM | POA: Diagnosis not present

## 2018-11-28 DIAGNOSIS — Z79899 Other long term (current) drug therapy: Secondary | ICD-10-CM | POA: Diagnosis not present

## 2018-11-28 DIAGNOSIS — Z79811 Long term (current) use of aromatase inhibitors: Secondary | ICD-10-CM

## 2018-11-28 DIAGNOSIS — M8580 Other specified disorders of bone density and structure, unspecified site: Secondary | ICD-10-CM | POA: Diagnosis not present

## 2018-11-28 DIAGNOSIS — M1990 Unspecified osteoarthritis, unspecified site: Secondary | ICD-10-CM | POA: Insufficient documentation

## 2018-11-28 DIAGNOSIS — Z86000 Personal history of in-situ neoplasm of breast: Secondary | ICD-10-CM | POA: Diagnosis not present

## 2018-11-28 DIAGNOSIS — Z791 Long term (current) use of non-steroidal anti-inflammatories (NSAID): Secondary | ICD-10-CM | POA: Diagnosis not present

## 2018-11-28 NOTE — Progress Notes (Signed)
Patient had labs drawn on 11/01/2018 with results in [place].

## 2018-11-29 DIAGNOSIS — Z79811 Long term (current) use of aromatase inhibitors: Secondary | ICD-10-CM | POA: Insufficient documentation

## 2018-11-29 DIAGNOSIS — M810 Age-related osteoporosis without current pathological fracture: Secondary | ICD-10-CM | POA: Insufficient documentation

## 2018-11-29 DIAGNOSIS — Z853 Personal history of malignant neoplasm of breast: Secondary | ICD-10-CM | POA: Insufficient documentation

## 2018-11-29 DIAGNOSIS — M8580 Other specified disorders of bone density and structure, unspecified site: Secondary | ICD-10-CM | POA: Insufficient documentation

## 2018-11-29 NOTE — Progress Notes (Signed)
Hematology/Oncology Consult note [facility] [phone] [phone]   Patient Care Team: Rusty Aus, MD as PCP - General (Internal Medicine)  REFERRING PROVIDER: Rusty Aus, MD REASON FOR VISIT:  Follow-up and management for DCIS.  HISTORY OF PRESENTING ILLNESS:  Lidiya Reise is a  83 y.o.  female with PMH listed below who was referred to me for evaluation of DCIS.  Patient is a retired Therapist, sports.  Patient had diagnostic mammogram right, on 01/13/2018 to follow up right upper quadrant calcifications.  Mammogram showed right upper quadrant 34m group of calcification, for which stereotactic core needle biopsy was performed.   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    Family history: denies any breast cancer or ovarian cancer OCP use: report remote use of OCP Estrogen and progesterone therapy: endorses history of hormone replacement therapy History of radiation to chest: denies.  Denies any breast pain, nipple discharge, breast skin changes.  #Treatment - She underwent lumpectomy of right breast on 05/02/2018. 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  Clip and biopsy site present.  Background fibrocystic changes.  No evidence of invasive carcinoma.  Size of DCIS at least 5 mm.  Margins uninvolved by DCIS.  Closest margin 6 mm. pTis(DCIS) pNx.  Hormone receptor studies were performed on previous biopsy specimen. ER> 90%, PR 51 to 90% -March 2020 finished adjuvant radiation. -April 2020 started on Arimidex 1 mg daily.  INTERVAL HISTORY JShelsie Tijerinois a 83y.o. female who has above history reviewed by me today presents for follow up visit for management of DCIS Patient has been on Arimidex 1 mg daily.  Clinically tolerating well.  Manageable aches and of hot flashes. No new concerns of her breast today. She had blood work done with her  primary care physician on 11/01/2018. No new complaints today.  Review of Systems  Constitutional: Negative for chills, fever, malaise/fatigue and weight loss.  HENT: Negative for nosebleeds and sore throat.   Eyes: Negative for double vision, photophobia and redness.  Respiratory: Negative for cough, shortness of breath and wheezing.   Cardiovascular: Negative for chest pain, palpitations, orthopnea and leg swelling.  Gastrointestinal: Negative for abdominal pain, blood in stool, nausea and vomiting.  Genitourinary: Negative for dysuria.  Musculoskeletal: Negative for back pain, myalgias and neck pain.  Skin: Negative for itching and rash.  Neurological: Negative for dizziness, tingling and tremors.  Endo/Heme/Allergies: Negative for environmental allergies. Does not bruise/bleed easily.  Psychiatric/Behavioral: Negative for depression and hallucinations.    MEDICAL HISTORY:  Past Medical History:  Diagnosis Date  . Anemia    RELATED TO CELIAC DX  . Arthritis   [place]   . Celiac disease   . DCIS (ductal carcinoma in situ) 01/23/2018   right breast DUCTAL CARCINOMA IN SITU, INTERMEDIATE GRADE, WITHOUT COMEDONECROSIS  . Hyperlipidemia   . Personal history of radiation therapy     SURGICAL HISTORY: Past Surgical History:  Procedure Laterality Date  . BREAST BIOPSY Right 01/23/2018   stereo calcs  x clip, DUCTAL CARCINOMA IN SITU, INTERMEDIATE GRADE, WITHOUT COMEDONECROSIS  . BREAST EXCISIONAL BIOPSY    . BREAST LUMPECTOMY Right 05/02/2018  . BREAST LUMPECTOMY WITH NEEDLE LOCALIZATION AND AXILLARY SENTINEL LYMPH NODE BX Right 05/02/2018   Procedure: BREAST LUMPECTOMY WITH NEEDLE LOCALIZATION AND POSSIBLE SENTINEL NODE BX;  Surgeon: SBenjamine Sprague DO;  Location: [facility] ORS;  Service: General;  Laterality: Right;  . TUBAL LIGATION  SOCIAL HISTORY: Social History   Socioeconomic History  . Marital status: Married    Spouse name: Not on file  . Number of  children: Not on file  . Years of education: Not on file  . Highest education level: Not on file  Occupational History  . Not on file  Social Needs  . Financial resource strain: Not on file  . Food insecurity    Worry: Not on file    Inability: Not on file  . Transportation needs    Medical: Not on file    Non-medical: Not on file  Tobacco Use  . Smoking status: Never Smoker  . Smokeless tobacco: Never Used  Substance and Sexual Activity  . Alcohol use: Yes    Frequency: Never    Comment: 1 GLASS WINE A WEEK   . Drug use: Never  . Sexual activity: Not on file  Lifestyle  . Physical activity    Days per week: Not on file    Minutes per session: Not on file  . Stress: Not on file  Relationships  . Social Herbalist on phone: Not on file    Gets together: Not on file    Attends religious service: Not on file    Active member of club or organization: Not on file    Attends meetings of clubs or organizations: Not on file    Relationship status: Not on file  . Intimate partner violence    Fear of current or ex partner: Not on file    Emotionally abused: Not on file    Physically abused: Not on file    Forced sexual activity: Not on file  Other Topics Concern  . Not on file  Social History Narrative  . Not on file    FAMILY HISTORY: Family History  Problem Relation Age of Onset  . Non-Hodgkin's lymphoma Mother   . Heart attack Father   . Hypertension Brother   . Breast cancer Neg Hx   . Colon cancer Neg Hx     ALLERGIES:  is allergic to wasp venom protein.  MEDICATIONS:  Current Outpatient Medications  Medication Sig Dispense Refill  . anastrozole (ARIMIDEX) 1 MG tablet TAKE 1 TABLET BY MOUTH  DAILY 90 tablet 3  . Biotin 5 MG TBDP Take 5,000 mcg by mouth daily.    . Calcium Carb-Cholecalciferol (CALCIUM-VITAMIN D) 600-400 MG-UNIT TABS Take 1 tablet by mouth daily.    . Cholecalciferol (VITAMIN D-1000 MAX ST) 1000 units tablet Take 1,000 Units by  mouth daily.    Marland Kitchen ibuprofen (ADVIL,MOTRIN) 800 MG tablet Take 1 tablet (800 mg total) by mouth every 8 (eight) hours as needed for mild pain or moderate pain. 30 tablet 0  . Magnesium 250 MG TABS Take 250 mg by mouth at bedtime as needed.     . naproxen sodium (ALEVE) 220 MG tablet Take by mouth.    . simvastatin (ZOCOR) 40 MG tablet Take 40 mg by mouth every evening.     . vitamin B-12 (CYANOCOBALAMIN) 500 MCG tablet Take 500 mcg by mouth daily.    . vitamin C (ASCORBIC ACID) 250 MG tablet Take 250 mg by mouth daily.    Marland Kitchen losartan-hydrochlorothiazide (HYZAAR) 100-25 MG tablet     . pravastatin (PRAVACHOL) 20 MG tablet      No current facility-administered medications for this visit.      PHYSICAL EXAMINATION: ECOG PERFORMANCE STATUS: 0 - Asymptomatic Vitals:   11/28/18 1440  BP:  119/71  Pulse: 76  Resp: 16  Temp: 98.8 F (37.1 C)   Filed Weights   11/28/18 1440  Weight: 126 lb 9.6 oz (57.4 kg)    Physical Exam Constitutional:      General: She is not in acute distress. HENT:     Head: Normocephalic and atraumatic.  Eyes:     General: No scleral icterus.    Pupils: Pupils are equal, round, and reactive to light.  Neck:     Musculoskeletal: Normal range of motion and neck supple.  Cardiovascular:     Rate and Rhythm: Normal rate and regular rhythm.     Heart sounds: Normal heart sounds.  Pulmonary:     Effort: Pulmonary effort is normal. No respiratory distress.     Breath sounds: No wheezing.  Abdominal:     General: Bowel sounds are normal. There is no distension.     Palpations: Abdomen is soft. There is no mass.     Tenderness: There is no abdominal tenderness.  Musculoskeletal: Normal range of motion.        General: No deformity.  Skin:    General: Skin is warm and dry.     Findings: No erythema or rash.  Neurological:     Mental Status: She is alert and oriented to person, place, and time.     Cranial Nerves: No cranial nerve deficit.     Coordination:  Coordination normal.  Psychiatric:        Behavior: Behavior normal.        Thought Content: Thought content normal.   Breast Exam: Deferred as patient has follow-up appointment with surgery Dr. Lysle Pearl tomorrow.   LABORATORY DATA:  I have reviewed the data as listed Lab Results  Component Value Date   WBC 5.8 08/22/2018   HGB 12.1 08/22/2018   HCT 36.9 08/22/2018   MCV 94.4 08/22/2018   PLT 264 08/22/2018   Recent Labs    07/10/18 1138 08/22/18 1335  NA 138 137  K 4.2 4.1  CL 104 102  CO2 28 28  GLUCOSE 113* 99  BUN 19 19  CREATININE 0.65 0.87  CALCIUM 9.2 9.4  GFRNONAA >60 >60  GFRAA >60 >60  PROT 6.3* 6.7  ALBUMIN 4.0 4.3  AST 24 21  ALT 20 17  ALKPHOS 54 49  BILITOT 0.5 0.6   Iron/TIBC/Ferritin/ %Sat No results found for: IRON, TIBC, FERRITIN, IRONPCTSAT   Reviewed patient's lab done at [facility] clinic on 10/07/2017  Cbc showed hemoglobin 12.6, wbc 6.2, platelet 315 CMP showed sodium 142, potassium 4.5, creatinine 0.8, calcium 9.6, AST 20, ALT 16, bilirubin 0.4  ASSESSMENT & PLAN:  1. History of ductal carcinoma in situ (DCIS) of breast   2. Aromatase inhibitor use   3. Osteopenia, unspecified location    #History of DCIS Currently on Arimidex 1 mg daily.  Tolerating well.  Plan Arimidex for 5 years. Last bilateral diagnostic mammogram on 09/26/2018 was independently reviewed by me and discussed with patient.  No mammographic evidence of malignancy in either breast. She was due for bilateral diagnostic mammogram in June 2021  #Osteopenia, she is due for a DEXA scan in October 2020.  Patient prefers to get DEXA scans through primary care physician's office. Recommend patient to continue calcium and vitamin D supplementation. # chronic aromatase inhibitor use.  She is made aware that Arimidex can potentially accelerate bone loss.  We discussed about rationale of using bisphosphonate or Prolia for osteopenia in the context of long-term  aromatase inhibitor use.   Potential side effects of bisphosphonate or Prolia discussed with patient, including but not limited to electrolyte imbalance, muscle and joint pain, osteonecrosis etc.  Patient reports that she has talked to her dentist.  We waiting for dental clearance.  Follow-up in 6 months.  Earlie Server, MD, PhD Hematology Oncology [facility] Pager- [phone] 11/29/2018

## 2019-01-01 ENCOUNTER — Encounter: Payer: Self-pay | Admitting: Radiation Oncology

## 2019-01-01 ENCOUNTER — Other Ambulatory Visit: Payer: Self-pay

## 2019-01-01 ENCOUNTER — Ambulatory Visit
Admission: RE | Admit: 2019-01-01 | Discharge: 2019-01-01 | Disposition: A | Discharge status: Home / Self Care | Payer: Medicare Other | Source: Ambulatory Visit | Attending: Radiation Oncology | Admitting: Radiation Oncology

## 2019-01-01 VITALS — BP 120/67 | HR 83 | Temp 98.1°F | Resp 16 | Wt 125.4 lb

## 2019-01-01 DIAGNOSIS — D0511 Intraductal carcinoma in situ of right breast: Secondary | ICD-10-CM | POA: Diagnosis not present

## 2019-01-01 DIAGNOSIS — Z170 Estrogen receptor positive status [ER+]: Secondary | ICD-10-CM | POA: Diagnosis not present

## 2019-01-01 DIAGNOSIS — Z79811 Long term (current) use of aromatase inhibitors: Secondary | ICD-10-CM | POA: Diagnosis not present

## 2019-01-01 DIAGNOSIS — Z923 Personal history of irradiation: Secondary | ICD-10-CM | POA: Insufficient documentation

## 2019-01-01 NOTE — Progress Notes (Signed)
Radiation Oncology Follow up Note  Name: Emma Aguirre   Date:   01/01/2019 MRN:  [id number] DOB: Sep 21, 1935    This 84 y.o. female presents to the clinic today for 56-monthfollow-up status post whole breast radiation to her right breast for ductal carcinoma in situ ER PR positive.  REFERRING PROVIDER: MRusty Aus MD  HPI: Patient is an 83year old female now about 6 months having completed whole breast radiation to her right breast for ER PR positive ductal carcinoma in situ seen today in routine follow-up she is doing well.  She specifically denies breast tenderness cough or bone pain..  She is currently on arimadex tolerating that well without side effect.  She had mammograms back in June which were BI-RADS 2 benign which I have reviewed.  COMPLICATIONS OF TREATMENT: none  FOLLOW UP COMPLIANCE: keeps appointments   PHYSICAL EXAM:  BP 120/67 (BP Location: Left Arm, Patient Position: Sitting)   Pulse 83   Temp 98.1 F (36.7 C) (Tympanic)   Resp 16   Wt 125 lb 6.4 oz (56.9 kg)   BMI 22.94 kg/m  Lungs are clear to A&P cardiac examination essentially unremarkable with regular rate and rhythm. No dominant mass or nodularity is noted in either breast in 2 positions examined. Incision is well-healed. No axillary or supraclavicular adenopathy is appreciated. Cosmetic result is excellent.  Well-developed well-nourished patient in NAD. HEENT reveals PERLA, EOMI, discs not visualized.  Oral cavity is clear. No oral mucosal lesions are identified. Neck is clear without evidence of cervical or supraclavicular adenopathy. Lungs are clear to A&P. Cardiac examination is essentially unremarkable with regular rate and rhythm without murmur rub or thrill. Abdomen is benign with no organomegaly or masses noted. Motor sensory and DTR levels are equal and symmetric in the upper and lower extremities. Cranial nerves II through XII are grossly intact. Proprioception is intact. No peripheral adenopathy or  edema is identified. No motor or sensory levels are noted. Crude visual fields are within normal range.  RADIOLOGY RESULTS: Mammograms reviewed compatible with above-stated findings  PLAN: Present time patient is doing well with no evidence of disease.  I am pleased with her overall progress.  I have asked to see her back in 1 year for follow-up.  Patient knows to call with any concerns.  She continues on arimadex without side effect.  I would like to take this opportunity to thank you for allowing me to participate in the care of your patient..Noreene Filbert MD

## 2019-02-08 ENCOUNTER — Other Ambulatory Visit: Payer: Self-pay

## 2019-02-08 DIAGNOSIS — Z20822 Contact with and (suspected) exposure to covid-19: Secondary | ICD-10-CM

## 2019-02-10 LAB — NOVEL CORONAVIRUS, NAA: SARS-CoV-2, NAA: NOT DETECTED

## 2019-02-12 ENCOUNTER — Telehealth: Payer: Self-pay | Admitting: Internal Medicine

## 2019-02-12 NOTE — Telephone Encounter (Signed)
Negative COVID results given. Patient results "NOT Detected." Caller expressed understanding. ° °

## 2019-04-26 ENCOUNTER — Telehealth: Payer: Self-pay | Admitting: *Deleted

## 2019-04-26 NOTE — Telephone Encounter (Signed)
Patient called stating that Dr Tasia Catchings discssed with her at her last appointment something about an infusion she needed. Patient would like to discuss this with Dr Tasia Catchings. Please return patient call [phone]

## 2019-04-26 NOTE — Telephone Encounter (Signed)
Patient reports a having left hip pain for a few weeks that she explains as a pressure when sitting.  Last night she had a stabbing pain in left hip and used a heating pad last night.  Today the stabbing pain has improved and hip pain is back to base line pain.  She is wondering if this pain could be related to the Arimidex.    She did have an evaluation with her dentist but we have not received dental clearance (as discussed at last MD visit).  I will call her dentis office at [phone] to request letter and provide our fax number.

## 2019-04-26 NOTE — Telephone Encounter (Signed)
Patient informed Dr. Tasia Catchings recommendation:  The Arimidex may cause joint pain but not usually stabbing pain.  She should hold Arimidex for 1 week and restart.  If the pain is not better she will need a hip x-ray.  Patient will let us know if the pain doesn't improve.

## 2019-05-15 ENCOUNTER — Telehealth: Payer: Self-pay

## 2019-05-15 ENCOUNTER — Other Ambulatory Visit: Payer: Self-pay | Admitting: Oncology

## 2019-05-15 NOTE — Telephone Encounter (Signed)
-----   Message from Earlie Server, MD sent at 05/15/2019  1:04 PM EST ----- Regarding: RE: [place] Please add Zometa to her next visit.  ----- Message ----- From: Vanice Sarah, CMA Sent: 05/15/2019   9:42 AM EST To: Evelina Dun, RN, Vanice Sarah, CMA, # Subject: RE: DENTAL CLEARANCE LETTER                    Scheduled for lab/MD on 2/23 ----- Message ----- From: Bryson Corona D Sent: 05/15/2019   9:32 AM EST To: Evelina Dun, RN, Vanice Sarah, CMA Subject: DENTAL CLEARANCE LETTER                        Hello,  I just uploaded a DENTAL CLEARANCE LETTER into this pt's chart in Epic under the Media tab. Thanks.

## 2019-05-15 NOTE — Telephone Encounter (Signed)
Left message informing patient that we have added infusion appt for Zometa on same day as her already scheduled 2/23 appts.

## 2019-05-29 ENCOUNTER — Inpatient Hospital Stay: Payer: Medicare Other | Attending: Oncology

## 2019-05-29 ENCOUNTER — Other Ambulatory Visit: Payer: Self-pay

## 2019-05-29 ENCOUNTER — Inpatient Hospital Stay: Payer: Medicare Other

## 2019-05-29 ENCOUNTER — Encounter: Payer: Self-pay | Admitting: Oncology

## 2019-05-29 ENCOUNTER — Inpatient Hospital Stay: Payer: Medicare Other | Admitting: Oncology

## 2019-05-29 VITALS — BP 142/67 | HR 97 | Temp 96.9°F | Resp 18 | Wt 126.2 lb

## 2019-05-29 DIAGNOSIS — Z791 Long term (current) use of non-steroidal anti-inflammatories (NSAID): Secondary | ICD-10-CM | POA: Insufficient documentation

## 2019-05-29 DIAGNOSIS — Z79899 Other long term (current) drug therapy: Secondary | ICD-10-CM | POA: Insufficient documentation

## 2019-05-29 DIAGNOSIS — M25552 Pain in left hip: Secondary | ICD-10-CM | POA: Insufficient documentation

## 2019-05-29 DIAGNOSIS — M816 Localized osteoporosis [Lequesne]: Secondary | ICD-10-CM | POA: Diagnosis not present

## 2019-05-29 DIAGNOSIS — Z79811 Long term (current) use of aromatase inhibitors: Secondary | ICD-10-CM

## 2019-05-29 DIAGNOSIS — M8580 Other specified disorders of bone density and structure, unspecified site: Secondary | ICD-10-CM

## 2019-05-29 DIAGNOSIS — D0511 Intraductal carcinoma in situ of right breast: Secondary | ICD-10-CM | POA: Insufficient documentation

## 2019-05-29 DIAGNOSIS — E785 Hyperlipidemia, unspecified: Secondary | ICD-10-CM | POA: Diagnosis not present

## 2019-05-29 DIAGNOSIS — M25561 Pain in right knee: Secondary | ICD-10-CM | POA: Insufficient documentation

## 2019-05-29 DIAGNOSIS — Z923 Personal history of irradiation: Secondary | ICD-10-CM | POA: Diagnosis not present

## 2019-05-29 DIAGNOSIS — M2550 Pain in unspecified joint: Secondary | ICD-10-CM | POA: Insufficient documentation

## 2019-05-29 DIAGNOSIS — Z86000 Personal history of in-situ neoplasm of breast: Secondary | ICD-10-CM

## 2019-05-29 DIAGNOSIS — M1990 Unspecified osteoarthritis, unspecified site: Secondary | ICD-10-CM | POA: Insufficient documentation

## 2019-05-29 DIAGNOSIS — Z170 Estrogen receptor positive status [ER+]: Secondary | ICD-10-CM | POA: Insufficient documentation

## 2019-05-29 LAB — COMPREHENSIVE METABOLIC PANEL
ALT: 18 U/L (ref 0–44)
AST: 19 U/L (ref 15–41)
Albumin: 4.2 g/dL (ref 3.5–5.0)
Alkaline Phosphatase: 55 U/L (ref 38–126)
Anion gap: 9 (ref 5–15)
BUN: 24 mg/dL — ABNORMAL HIGH (ref 8–23)
CO2: 26 mmol/L (ref 22–32)
Calcium: 9.6 mg/dL (ref 8.9–10.3)
Chloride: 102 mmol/L (ref 98–111)
Creatinine, Ser: 0.81 mg/dL (ref 0.44–1.00)
GFR calc Af Amer: >60 mL/min (ref >60)
GFR calc non Af Amer: >60 mL/min (ref >60)
Glucose, Bld: 103 mg/dL — ABNORMAL HIGH (ref 70–99)
Potassium: 4.5 mmol/L (ref 3.5–5.1)
Sodium: 137 mmol/L (ref 135–145)
Total Bilirubin: 0.5 mg/dL (ref 0.3–1.2)
Total Protein: 6.9 g/dL (ref 6.5–8.1)

## 2019-05-29 LAB — CBC WITH DIFFERENTIAL/PLATELET
Abs Immature Granulocytes: 0.01 10*3/uL (ref 0.00–0.07)
Basophils Absolute: 0.1 10*3/uL (ref 0.0–0.1)
Basophils Relative: 1 %
Eosinophils Absolute: 0.2 10*3/uL (ref 0.0–0.5)
Eosinophils Relative: 4 %
HCT: 37.4 % (ref 36.0–46.0)
Hemoglobin: 12 g/dL (ref 12.0–15.0)
Immature Granulocytes: 0 %
Lymphocytes Relative: 18 %
Lymphs Abs: 1 10*3/uL (ref 0.7–4.0)
MCH: 30.8 pg (ref 26.0–34.0)
MCHC: 32.1 g/dL (ref 30.0–36.0)
MCV: 95.9 fL (ref 80.0–100.0)
Monocytes Absolute: 0.6 10*3/uL (ref 0.1–1.0)
Monocytes Relative: 11 %
Neutro Abs: 3.8 10*3/uL (ref 1.7–7.7)
Neutrophils Relative %: 66 %
Platelets: 325 10*3/uL (ref 150–400)
RBC: 3.9 MIL/uL (ref 3.87–5.11)
RDW: 12.1 % (ref 11.5–15.5)
WBC: 5.7 10*3/uL (ref 4.0–10.5)
nRBC: 0 % (ref 0.0–0.2)

## 2019-05-29 MED ORDER — ZOLEDRONIC ACID 4 MG/5ML IV CONC
3.0000 mg | Freq: Once | INTRAVENOUS | Status: AC
  Administered 2019-05-29: 3 mg via INTRAVENOUS
  Filled 2019-05-29: qty 3.75

## 2019-05-29 MED ORDER — SODIUM CHLORIDE 0.9 % IV SOLN
Freq: Once | INTRAVENOUS | Status: AC
  Filled 2019-05-29: qty 250

## 2019-05-29 NOTE — Progress Notes (Signed)
Hematology/Oncology Consult note [facility] Telephone:([phone] [phone]   Patient Care Team: Rusty Aus, MD as PCP - General (Internal Medicine)  REFERRING PROVIDER: Rusty Aus, MD REASON FOR VISIT:  Follow-up and management for DCIS.  HISTORY OF PRESENTING ILLNESS:  Emma Aguirre is a  84 y.o.  female with PMH listed below who was referred to me for evaluation of DCIS.  Patient is a retired Therapist, sports.  Patient had diagnostic mammogram right, on 01/13/2018 to follow up right upper quadrant calcifications.  Mammogram showed right upper quadrant 74m group of calcification, for which stereotactic core needle biopsy was performed.   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    Family history: denies any breast cancer or ovarian cancer OCP use: report remote use of OCP Estrogen and progesterone therapy: endorses history of hormone replacement therapy History of radiation to chest: denies.  Denies any breast pain, nipple discharge, breast skin changes.  #Treatment - She underwent lumpectomy of right breast on 05/02/2018. 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  Clip and biopsy site present.  Background fibrocystic changes.  No evidence of invasive carcinoma.  Size of DCIS at least 5 mm.  Margins uninvolved by DCIS.  Closest margin 6 mm. pTis(DCIS) pNx.  Hormone receptor studies were performed on previous biopsy specimen. ER> 90%, PR 51 to 90% -March 2020 finished adjuvant radiation. -April 2020 started on Arimidex 1 mg daily.  INTERVAL HISTORY Emma Aguirre a 84y.o. female who has above history reviewed by me today presents for follow up visit for management of DCIS, status post lumpectomy 05/02/2018 Patient has been on Arimidex 1 mg daily since April 2020. She reports feeling morning stiffness, left hip pain as well as right knee pain. She has  osteoporosis on most recent DEXA scan, osteoporosis of forearm. Patient has had dental clearance done and she will be started on Zometa treatments. .  She also feels that her hair growth has slowed down as well. Review of Systems  Constitutional: Negative for chills, fever, malaise/fatigue and weight loss.  HENT: Negative for nosebleeds and sore throat.   Eyes: Negative for double vision, photophobia and redness.  Respiratory: Negative for cough, shortness of breath and wheezing.   Cardiovascular: Negative for chest pain, palpitations, orthopnea and leg swelling.  Gastrointestinal: Negative for abdominal pain, blood in stool, nausea and vomiting.  Genitourinary: Negative for dysuria.  Musculoskeletal: Positive for joint pain. Negative for back pain, myalgias and neck pain.       Left hip and right knee pain. Morning stiffness  Skin: Negative for itching and rash.  Neurological: Negative for dizziness, tingling and tremors.  Endo/Heme/Allergies: Negative for environmental allergies. Does not bruise/bleed easily.  Psychiatric/Behavioral: Negative for depression and hallucinations.    MEDICAL HISTORY:  Past Medical History:  Diagnosis Date  . Anemia    RELATED TO CELIAC DX  . Arthritis   . Breast cancer ([place]   . Celiac disease   . DCIS (ductal carcinoma in situ) 01/23/2018   right breast DUCTAL CARCINOMA IN SITU, INTERMEDIATE GRADE, WITHOUT COMEDONECROSIS  . Hyperlipidemia   . Personal history of radiation therapy     SURGICAL HISTORY: Past Surgical History:  Procedure Laterality Date  . BREAST BIOPSY Right 01/23/2018   stereo calcs  x clip, DUCTAL CARCINOMA IN SITU, INTERMEDIATE GRADE, WITHOUT COMEDONECROSIS  . BREAST EXCISIONAL BIOPSY    . BREAST LUMPECTOMY Right 05/02/2018  . BREAST LUMPECTOMY WITH  NEEDLE LOCALIZATION AND AXILLARY SENTINEL LYMPH NODE BX Right 05/02/2018   Procedure: BREAST LUMPECTOMY WITH NEEDLE LOCALIZATION AND POSSIBLE SENTINEL NODE BX;  Surgeon: Benjamine Sprague, DO;  Location: [facility] ORS;  Service: General;  Laterality: Right;  . TUBAL LIGATION      SOCIAL HISTORY: Social History   Socioeconomic History  . Marital status: Married    Spouse name: Not on file  . Number of children: Not on file  . Years of education: Not on file  . Highest education level: Not on file  Occupational History  . Not on file  Tobacco Use  . Smoking status: Never Smoker  . Smokeless tobacco: Never Used  Substance and Sexual Activity  . Alcohol use: Yes    Comment: 1 GLASS WINE A WEEK   . Drug use: Never  . Sexual activity: Not on file  Other Topics Concern  . Not on file  Social History Narrative  . Not on file   Social Determinants of Health   Financial Resource Strain:   . Difficulty of Paying Living Expenses: Not on file  Food Insecurity:   . Worried About Charity fundraiser in the Last Year: Not on file  . Ran Out of Food in the Last Year: Not on file  Transportation Needs:   . Lack of Transportation (Medical): Not on file  . Lack of Transportation (Non-Medical): Not on file  Physical Activity:   . Days of Exercise per Week: Not on file  . Minutes of Exercise per Session: Not on file  Stress:   . Feeling of Stress : Not on file  Social Connections:   . Frequency of Communication with Friends and Family: Not on file  . Frequency of Social Gatherings with Friends and Family: Not on file  . Attends Religious Services: Not on file  . Active Member of Clubs or Organizations: Not on file  . Attends Archivist Meetings: Not on file  . Marital Status: Not on file  Intimate Partner Violence:   . Fear of Current or Ex-Partner: Not on file  . Emotionally Abused: Not on file  . Physically Abused: Not on file  . Sexually Abused: Not on file    FAMILY HISTORY: Family History  Problem Relation Age of Onset  . Non-Hodgkin's lymphoma Mother   . Heart attack Father   . Hypertension Brother   . Breast cancer Neg Hx   . Colon cancer  Neg Hx     ALLERGIES:  is allergic to wasp venom protein.  MEDICATIONS:  Current Outpatient Medications  Medication Sig Dispense Refill  . anastrozole (ARIMIDEX) 1 MG tablet TAKE 1 TABLET BY MOUTH  DAILY 90 tablet 3  . Biotin 5 MG TBDP Take 5,000 mcg by mouth daily.    . Calcium Carb-Cholecalciferol (CALCIUM-VITAMIN D) 600-400 MG-UNIT TABS Take 1 tablet by mouth daily.    . Cholecalciferol (VITAMIN D-1000 MAX ST) 1000 units tablet Take 1,000 Units by mouth daily.    Marland Kitchen ibuprofen (ADVIL,MOTRIN) 800 MG tablet Take 1 tablet (800 mg total) by mouth every 8 (eight) hours as needed for mild pain or moderate pain. 30 tablet 0  . losartan-hydrochlorothiazide (HYZAAR) 100-25 MG tablet     . Magnesium 250 MG TABS Take 250 mg by mouth at bedtime as needed.     . naproxen sodium (ALEVE) 220 MG tablet Take by mouth.    . simvastatin (ZOCOR) 40 MG tablet Take 40 mg by mouth every evening.     Marland Kitchen  vitamin B-12 (CYANOCOBALAMIN) 500 MCG tablet Take 500 mcg by mouth daily.    . vitamin C (ASCORBIC ACID) 250 MG tablet Take 250 mg by mouth daily.    Marland Kitchen tiZANidine (ZANAFLEX) 2 MG tablet      No current facility-administered medications for this visit.     PHYSICAL EXAMINATION: ECOG PERFORMANCE STATUS: 1 - Symptomatic but completely ambulatory Vitals:   05/29/19 1327  BP: (!) 142/67  Pulse: 97  Resp: 18  Temp: (!) 96.9 F (36.1 C)   Filed Weights   05/29/19 1327  Weight: 126 lb 3.2 oz (57.2 kg)    Physical Exam Constitutional:      General: She is not in acute distress. HENT:     Head: Normocephalic and atraumatic.  Eyes:     General: No scleral icterus.    Pupils: Pupils are equal, round, and reactive to light.  Cardiovascular:     Rate and Rhythm: Normal rate and regular rhythm.     Heart sounds: Normal heart sounds.  Pulmonary:     Effort: Pulmonary effort is normal. No respiratory distress.     Breath sounds: No wheezing.  Abdominal:     General: Bowel sounds are normal.      Palpations: Abdomen is soft.  Musculoskeletal:        General: No deformity. Normal range of motion.     Cervical back: Normal range of motion and neck supple.  Skin:    General: Skin is warm and dry.     Findings: No erythema or rash.  Neurological:     Mental Status: She is alert and oriented to person, place, and time. Mental status is at baseline.     Cranial Nerves: No cranial nerve deficit.     Coordination: Coordination normal.  Psychiatric:        Mood and Affect: Mood normal.   Breast Exam: Breast exam was performed in seated and lying down position. Patient is status post right breast lumpectomy with a well-healed surgical scar. There is some tenderness with palpation around the surgical scar, as well as tissue thickening around the surgical scar.  No palpable axillary lymphadenopathy bilaterally.  No palpable breast mass on the left side.    LABORATORY DATA:  I have reviewed the data as listed Lab Results  Component Value Date   WBC 5.7 05/29/2019   HGB 12.0 05/29/2019   HCT 37.4 05/29/2019   MCV 95.9 05/29/2019   PLT 325 05/29/2019   Recent Labs    07/10/18 1138 08/22/18 1335 05/29/19 1302  NA 138 137 137  K 4.2 4.1 4.5  CL 104 102 102  CO2 28 28 26   GLUCOSE 113* 99 103*  BUN 19 19 24*  CREATININE 0.65 0.87 0.81  CALCIUM 9.2 9.4 9.6  GFRNONAA >60 >60 >60  GFRAA >60 >60 >60  PROT 6.3* 6.7 6.9  ALBUMIN 4.0 4.3 4.2  AST 24 21 19   ALT 20 17 18   ALKPHOS 54 49 55  BILITOT 0.5 0.6 0.5   Iron/TIBC/Ferritin/ %Sat No results found for: IRON, TIBC, FERRITIN, IRONPCTSAT   Reviewed patient's lab done at [facility] clinic on 10/07/2017  Cbc showed hemoglobin 12.6, wbc 6.2, platelet 315 CMP showed sodium 142, potassium 4.5, creatinine 0.8, calcium 9.6, AST 20, ALT 16, bilirubin 0.4  ASSESSMENT & PLAN:  1. History of ductal carcinoma in situ (DCIS) of breast   2. Aromatase inhibitor use   3. Localized osteoporosis without current pathological fracture     #History  of DCIS Currently on Arimidex 1 mg daily. She tolerates with mild to moderate difficulties including joint pain. In the context of osteoporosis, we discussed about options of switching to tamoxifen.  Rationale and potential side effects of tamoxifen was discussed with her.  She prefers to stay on aromatase inhibitor. Discussed that duration of the treatment is 5 years. She is due for bilateral diagnostic mammogram in June 2021.  Will obtain.  #Localized osteoporosis in the context of aromatase inhibitor use. Discussed at length the risk factors [race, post-menopausal status, use of AI]; prevention/treatment strategies- including but not limited to exercise calcium 1233m and vitamin D. . Recommend Zometa 4 mg IV every 6 months. Discussed the potential side effects including but not limited to- hypocalcemia and ONJ, joint and muscle pain; discussed not to have invasive dental procedures few weeks around the time of the injections.  Patient has obtained dental clearance.  We will proceed with the first dose of Zometa today. Follow-up in 6 months.  ZEarlie Server MD, PhD Hematology Oncology [facility] HospitalPager- [phone]/23/2021

## 2019-05-29 NOTE — Progress Notes (Signed)
Left hip and right knee pain with morning stiffness.  Also concerned with slow hair grown since taking Anastrozole.

## 2019-05-29 NOTE — Addendum Note (Signed)
Addended by: Earlie Server on: 05/29/2019 04:57 PM   Modules accepted: Orders

## 2019-09-21 ENCOUNTER — Other Ambulatory Visit: Payer: Self-pay | Admitting: Oncology

## 2019-09-27 ENCOUNTER — Ambulatory Visit
Admission: RE | Admit: 2019-09-27 | Discharge: 2019-09-27 | Disposition: A | Discharge status: Home / Self Care | Payer: Medicare Other | Source: Ambulatory Visit | Attending: Oncology | Admitting: Oncology

## 2019-09-27 DIAGNOSIS — Z86000 Personal history of in-situ neoplasm of breast: Secondary | ICD-10-CM

## 2019-10-24 ENCOUNTER — Other Ambulatory Visit: Payer: Self-pay | Admitting: Family Medicine

## 2019-10-24 DIAGNOSIS — R1011 Right upper quadrant pain: Secondary | ICD-10-CM

## 2019-10-26 ENCOUNTER — Ambulatory Visit
Admission: RE | Admit: 2019-10-26 | Discharge: 2019-10-26 | Disposition: A | Discharge status: Home / Self Care | Payer: Medicare Other | Source: Ambulatory Visit | Attending: Family Medicine | Admitting: Family Medicine

## 2019-10-26 ENCOUNTER — Other Ambulatory Visit: Payer: Self-pay

## 2019-10-26 DIAGNOSIS — R1011 Right upper quadrant pain: Secondary | ICD-10-CM | POA: Diagnosis present

## 2019-11-21 ENCOUNTER — Telehealth: Payer: Self-pay | Admitting: Oncology

## 2019-11-21 NOTE — Telephone Encounter (Signed)
Patient phoned on this date and stated that she would not be able to attend her appt on 11-26-19 as she would be out of town. Appts rescheduled to 12-07-19 per patient's request.

## 2019-11-26 ENCOUNTER — Inpatient Hospital Stay: Payer: Medicare Other

## 2019-11-26 ENCOUNTER — Inpatient Hospital Stay: Payer: Medicare Other | Admitting: Oncology

## 2019-12-07 ENCOUNTER — Inpatient Hospital Stay: Payer: Medicare Other

## 2019-12-07 ENCOUNTER — Inpatient Hospital Stay: Payer: Medicare Other | Admitting: Oncology

## 2019-12-07 ENCOUNTER — Encounter: Payer: Self-pay | Admitting: Oncology

## 2019-12-07 ENCOUNTER — Inpatient Hospital Stay: Payer: Medicare Other | Attending: Oncology

## 2019-12-07 ENCOUNTER — Other Ambulatory Visit: Payer: Self-pay

## 2019-12-07 VITALS — BP 120/68 | HR 75 | Temp 97.4°F | Resp 18 | Wt 125.1 lb

## 2019-12-07 DIAGNOSIS — Z86000 Personal history of in-situ neoplasm of breast: Secondary | ICD-10-CM | POA: Diagnosis not present

## 2019-12-07 DIAGNOSIS — M1990 Unspecified osteoarthritis, unspecified site: Secondary | ICD-10-CM | POA: Insufficient documentation

## 2019-12-07 DIAGNOSIS — Z170 Estrogen receptor positive status [ER+]: Secondary | ICD-10-CM | POA: Insufficient documentation

## 2019-12-07 DIAGNOSIS — Z79811 Long term (current) use of aromatase inhibitors: Secondary | ICD-10-CM

## 2019-12-07 DIAGNOSIS — Z923 Personal history of irradiation: Secondary | ICD-10-CM | POA: Insufficient documentation

## 2019-12-07 DIAGNOSIS — E785 Hyperlipidemia, unspecified: Secondary | ICD-10-CM | POA: Diagnosis not present

## 2019-12-07 DIAGNOSIS — D0591 Unspecified type of carcinoma in situ of right breast: Secondary | ICD-10-CM | POA: Insufficient documentation

## 2019-12-07 DIAGNOSIS — M816 Localized osteoporosis [Lequesne]: Secondary | ICD-10-CM

## 2019-12-07 DIAGNOSIS — Z79899 Other long term (current) drug therapy: Secondary | ICD-10-CM | POA: Diagnosis not present

## 2019-12-07 DIAGNOSIS — M8580 Other specified disorders of bone density and structure, unspecified site: Secondary | ICD-10-CM

## 2019-12-07 LAB — COMPREHENSIVE METABOLIC PANEL
ALT: 18 U/L (ref 0–44)
AST: 19 U/L (ref 15–41)
Albumin: 4.1 g/dL (ref 3.5–5.0)
Alkaline Phosphatase: 58 U/L (ref 38–126)
Anion gap: 9 (ref 5–15)
BUN: 27 mg/dL — ABNORMAL HIGH (ref 8–23)
CO2: 27 mmol/L (ref 22–32)
Calcium: 9.5 mg/dL (ref 8.9–10.3)
Chloride: 102 mmol/L (ref 98–111)
Creatinine, Ser: 0.82 mg/dL (ref 0.44–1.00)
GFR calc Af Amer: >60 mL/min (ref >60)
GFR calc non Af Amer: >60 mL/min (ref >60)
Glucose, Bld: 81 mg/dL (ref 70–99)
Potassium: 4.4 mmol/L (ref 3.5–5.1)
Sodium: 138 mmol/L (ref 135–145)
Total Bilirubin: 0.6 mg/dL (ref 0.3–1.2)
Total Protein: 6.5 g/dL (ref 6.5–8.1)

## 2019-12-07 LAB — CBC WITH DIFFERENTIAL/PLATELET
Abs Immature Granulocytes: 0.02 10*3/uL (ref 0.00–0.07)
Basophils Absolute: 0.1 10*3/uL (ref 0.0–0.1)
Basophils Relative: 1 %
Eosinophils Absolute: 0.2 10*3/uL (ref 0.0–0.5)
Eosinophils Relative: 4 %
HCT: 36.7 % (ref 36.0–46.0)
Hemoglobin: 12.4 g/dL (ref 12.0–15.0)
Immature Granulocytes: 0 %
Lymphocytes Relative: 20 %
Lymphs Abs: 1.2 10*3/uL (ref 0.7–4.0)
MCH: 31.2 pg (ref 26.0–34.0)
MCHC: 33.8 g/dL (ref 30.0–36.0)
MCV: 92.4 fL (ref 80.0–100.0)
Monocytes Absolute: 0.7 10*3/uL (ref 0.1–1.0)
Monocytes Relative: 12 %
Neutro Abs: 3.9 10*3/uL (ref 1.7–7.7)
Neutrophils Relative %: 63 %
Platelets: 281 10*3/uL (ref 150–400)
RBC: 3.97 MIL/uL (ref 3.87–5.11)
RDW: 12.2 % (ref 11.5–15.5)
WBC: 6.2 10*3/uL (ref 4.0–10.5)
nRBC: 0 % (ref 0.0–0.2)

## 2019-12-07 MED ORDER — SODIUM CHLORIDE 0.9 % IV SOLN
Freq: Once | INTRAVENOUS | Status: AC
  Filled 2019-12-07: qty 250

## 2019-12-07 MED ORDER — ZOLEDRONIC ACID 4 MG/5ML IV CONC
3.0000 mg | Freq: Once | INTRAVENOUS | Status: AC
  Administered 2019-12-07: 3 mg via INTRAVENOUS
  Filled 2019-12-07: qty 3.75

## 2019-12-07 NOTE — Progress Notes (Signed)
Pt here for follow up. No new concerns voiced.   

## 2019-12-08 NOTE — Progress Notes (Signed)
Hematology/Oncology Consult note [facility] [phone] [phone]   Patient Care Team: Rusty Aus, MD as PCP - General (Internal Medicine)  REFERRING PROVIDER: Rusty Aus, MD REASON FOR VISIT:  Follow-up and management for DCIS.  HISTORY OF PRESENTING ILLNESS:  Emma Aguirre is a  84 y.o.  female with PMH listed below who was referred to me for evaluation of DCIS.  Patient is a retired Therapist, sports.  Patient had diagnostic mammogram right, on 01/13/2018 to follow up right upper quadrant calcifications.  Mammogram showed right upper quadrant 2m group of calcification, for which stereotactic core needle biopsy was performed.   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    Family history: denies any breast cancer or ovarian cancer OCP use: report remote use of OCP Estrogen and progesterone therapy: endorses history of hormone replacement therapy History of radiation to chest: denies.  Denies any breast pain, nipple discharge, breast skin changes.  #Treatment - She underwent lumpectomy of right breast on 05/02/2018. 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  Clip and biopsy site present.  Background fibrocystic changes.  No evidence of invasive carcinoma.  Size of DCIS at least 5 mm.  Margins uninvolved by DCIS.  Closest margin 6 mm. pTis(DCIS) pNx.  Hormone receptor studies were performed on previous biopsy specimen. ER> 90%, PR 51 to 90% -March 2020 finished adjuvant radiation. -April 2020 started on Arimidex 1 mg daily. [facility] on Zometa every 6 months.  Discussed about other options about switching to tamoxifen and patient prefers to stay on aromatase inhibitor.   INTERVAL HISTORY Emma Aguirre a 84y.o. female who has above history reviewed by me today presents for follow up visit for management of DCIS,  status post lumpectomy 05/02/2018 Patient takes Arimidex.  She has chronic morning stiffness, joint pain.  Manageable symptoms. Osteoporosis on Zometa treatments. Today she has no new complaints. . Review of Systems  Constitutional: Negative for chills, fever, malaise/fatigue and weight loss.  HENT: Negative for nosebleeds and sore throat.   Eyes: Negative for double vision, photophobia and redness.  Respiratory: Negative for cough, shortness of breath and wheezing.   Cardiovascular: Negative for chest pain, palpitations, orthopnea and leg swelling.  Gastrointestinal: Negative for abdominal pain, blood in stool, nausea and vomiting.  Genitourinary: Negative for dysuria.  Musculoskeletal: Positive for joint pain. Negative for back pain, myalgias and neck pain.       Left hip and right knee pain. Morning stiffness  Skin: Negative for itching and rash.  Neurological: Negative for dizziness, tingling and tremors.  Endo/Heme/Allergies: Negative for environmental allergies. Does not bruise/bleed easily.  Psychiatric/Behavioral: Negative for depression and hallucinations.    MEDICAL HISTORY:  Past Medical History:  Diagnosis Date  . Anemia    RELATED TO CELIAC DX  . Arthritis   . Breast [place]  . Celiac disease   . DCIS (ductal carcinoma in situ) 01/23/2018   right breast DUCTAL CARCINOMA IN SITU, INTERMEDIATE GRADE, WITHOUT COMEDONECROSIS  . Hyperlipidemia   . Personal history of radiation therapy     SURGICAL HISTORY: Past Surgical History:  Procedure Laterality Date  . BREAST BIOPSY Right 01/23/2018   stereo calcs  x clip, DUCTAL CARCINOMA IN SITU, INTERMEDIATE GRADE, WITHOUT COMEDONECROSIS  . BREAST EXCISIONAL BIOPSY    . BREAST LUMPECTOMY Right 05/02/2018  . BREAST LUMPECTOMY WITH NEEDLE LOCALIZATION AND AXILLARY SENTINEL LYMPH NODE BX Right 05/02/2018  Procedure: BREAST LUMPECTOMY WITH NEEDLE LOCALIZATION AND POSSIBLE SENTINEL NODE BX;  Surgeon: Benjamine Sprague, DO;   Location: [facility] ORS;  Service: General;  Laterality: Right;  . TUBAL LIGATION      SOCIAL HISTORY: Social History   Socioeconomic History  . Marital status: Married    Spouse name: Not on file  . Number of children: Not on file  . Years of education: Not on file  . Highest education level: Not on file  Occupational History  . Not on file  Tobacco Use  . Smoking status: Never Smoker  . Smokeless tobacco: Never Used  Vaping Use  . Vaping Use: Never used  Substance and Sexual Activity  . Alcohol use: Yes    Comment: 1 GLASS WINE A WEEK   . Drug use: Never  . Sexual activity: Not on file  Other Topics Concern  . Not on file  Social History Narrative  . Not on file   Social Determinants of Health   Financial Resource Strain:   . Difficulty of Paying Living Expenses: Not on file  Food Insecurity:   . Worried About Charity fundraiser in the Last Year: Not on file  . Ran Out of Food in the Last Year: Not on file  Transportation Needs:   . Lack of Transportation (Medical): Not on file  . Lack of Transportation (Non-Medical): Not on file  Physical Activity:   . Days of Exercise per Week: Not on file  . Minutes of Exercise per Session: Not on file  Stress:   . Feeling of Stress : Not on file  Social Connections:   . Frequency of Communication with Friends and Family: Not on file  . Frequency of Social Gatherings with Friends and Family: Not on file  . Attends Religious Services: Not on file  . Active Member of Clubs or Organizations: Not on file  . Attends Archivist Meetings: Not on file  . Marital Status: Not on file  Intimate Partner Violence:   . Fear of Current or Ex-Partner: Not on file  . Emotionally Abused: Not on file  . Physically Abused: Not on file  . Sexually Abused: Not on file    FAMILY HISTORY: Family History  Problem Relation Age of Onset  . Non-Hodgkin's lymphoma Mother   . Heart attack Father   . Hypertension Brother   . Breast cancer  Neg Hx   . Colon cancer Neg Hx     ALLERGIES:  is allergic to wasp venom protein.  MEDICATIONS:  Current Outpatient Medications  Medication Sig Dispense Refill  . anastrozole (ARIMIDEX) 1 MG tablet TAKE 1 TABLET BY MOUTH  DAILY 90 tablet 3  . Biotin 5 MG TBDP Take 5,000 mcg by mouth daily.    . calcium citrate-vitamin D (CITRACAL+D) 315-200 MG-UNIT tablet Take by mouth.    . Cholecalciferol (VITAMIN D-1000 MAX ST) 1000 units tablet Take 1,000 Units by mouth daily.    . cyanocobalamin 1000 MCG tablet Take by mouth.    . Magnesium 250 MG TABS Take 250 mg by mouth at bedtime as needed.     . naproxen sodium (ALEVE) 220 MG tablet Take by mouth.    . simvastatin (ZOCOR) 40 MG tablet Take 40 mg by mouth every evening.     . vitamin C (ASCORBIC ACID) 250 MG tablet Take 250 mg by mouth daily.     No current facility-administered medications for this visit.     PHYSICAL EXAMINATION: ECOG PERFORMANCE STATUS:  1 - Symptomatic but completely ambulatory Vitals:   12/07/19 1354  BP: 120/68  Pulse: 75  Resp: 18  Temp: (!) 97.4 F (36.3 C)   Filed Weights   12/07/19 1354  Weight: 125 lb 1.6 oz (56.7 kg)    Physical Exam Constitutional:      General: She is not in acute distress. HENT:     Head: Normocephalic and atraumatic.  Eyes:     General: No scleral icterus.    Pupils: Pupils are equal, round, and reactive to light.  Cardiovascular:     Rate and Rhythm: Normal rate and regular rhythm.     Heart sounds: Normal heart sounds.  Pulmonary:     Effort: Pulmonary effort is normal. No respiratory distress.     Breath sounds: No wheezing.  Abdominal:     General: Bowel sounds are normal.     Palpations: Abdomen is soft.  Musculoskeletal:        General: No deformity. Normal range of motion.     Cervical back: Normal range of motion and neck supple.  Skin:    General: Skin is warm and dry.     Findings: No erythema or rash.  Neurological:     Mental Status: She is alert and  oriented to person, place, and time. Mental status is at baseline.     Cranial Nerves: No cranial nerve deficit.     Coordination: Coordination normal.  Psychiatric:        Mood and Affect: Mood normal.   Breast Exam: Breast exam was performed in seated and lying down position. Patient is status post right breast lumpectomy with a well-healed surgical scar with tissue thickening around the surgical scar..  No palpable axillary lymphadenopathy bilaterally.  No palpable breast mass on the left side.    LABORATORY DATA:  I have reviewed the data as listed Lab Results  Component Value Date   WBC 6.2 12/07/2019   HGB 12.4 12/07/2019   HCT 36.7 12/07/2019   MCV 92.4 12/07/2019   PLT 281 12/07/2019   Recent Labs    05/29/19 1302 12/07/19 1329  NA 137 138  K 4.5 4.4  CL 102 102  CO2 26 27  GLUCOSE 103* 81  BUN 24* 27*  CREATININE 0.81 0.82  CALCIUM 9.6 9.5  GFRNONAA >60 >60  GFRAA >60 >60  PROT 6.9 6.5  ALBUMIN 4.2 4.1  AST 19 19  ALT 18 18  ALKPHOS 55 58  BILITOT 0.5 0.6   Iron/TIBC/Ferritin/ %Sat No results found for: IRON, TIBC, FERRITIN, IRONPCTSAT   Reviewed patient's lab done at [facility] clinic on 10/07/2017  Cbc showed hemoglobin 12.6, wbc 6.2, platelet 315 CMP showed sodium 142, potassium 4.5, creatinine 0.8, calcium 9.6, AST 20, ALT 16, bilirubin 0.4  ASSESSMENT & PLAN:  1. History of ductal carcinoma in situ (DCIS) of breast   2. Aromatase inhibitor use   3. Localized osteoporosis without current pathological fracture    #History of DCIS Currently on Arimidex 1 mg daily.  Clinically she is doing very well.  Manageable symptoms. Continue current regimen.  Duration of the treatment is 5 years. June 2021 bilateral diagnostic mammogram was independently reviewed by me and discussed with patient.  No radiographic evidence of malignancy.  She will repeat another mammogram in 1 year.  Osteoporosis, in the context of aromatase inhibitor use. Recommend patient to  continue calcium and vitamin D supplementation. Proceed with Zometa 64m every 6 months.  She will get Zometa today.  Follow-up in 6 months.  Earlie Server, MD, PhD Hematology Oncology [facility] Pager- [phone] 12/08/2019

## 2019-12-31 ENCOUNTER — Ambulatory Visit
Admission: RE | Admit: 2019-12-31 | Discharge: 2019-12-31 | Disposition: A | Discharge status: Home / Self Care | Payer: Medicare Other | Source: Ambulatory Visit | Attending: Radiation Oncology | Admitting: Radiation Oncology

## 2019-12-31 ENCOUNTER — Other Ambulatory Visit: Payer: Self-pay

## 2019-12-31 VITALS — BP 125/56 | HR 76 | Temp 96.5°F | Resp 16 | Wt 124.7 lb

## 2019-12-31 DIAGNOSIS — D0511 Intraductal carcinoma in situ of right breast: Secondary | ICD-10-CM | POA: Diagnosis not present

## 2019-12-31 DIAGNOSIS — Z170 Estrogen receptor positive status [ER+]: Secondary | ICD-10-CM | POA: Diagnosis not present

## 2019-12-31 DIAGNOSIS — Z923 Personal history of irradiation: Secondary | ICD-10-CM | POA: Diagnosis not present

## 2019-12-31 DIAGNOSIS — Z79811 Long term (current) use of aromatase inhibitors: Secondary | ICD-10-CM | POA: Insufficient documentation

## 2019-12-31 NOTE — Progress Notes (Signed)
Radiation Oncology Follow up Note  Name: Emma Aguirre   Date:   12/31/2019 MRN:  [id number] DOB: [birth year]    This 84 y.o. female presents to the clinic today for 53-monthfollow-up status post whole breast radiation to her right breast for ER/PR positive ductal carcinoma in situ.  REFERRING PROVIDER: MRusty Aus MD  HPI: Patient is an 84year old female now out 18 months having completed whole breast radiation to her right breast for ER/PR positive ductal carcinoma in situ seen today in routine follow-up she is doing well.  She specifically denies breast tenderness cough or bone pain.  She had mammograms back in June which I have reviewed were BI-RADS 2 benign.  She is currently on Arimidex tolerant well without side effect.  COMPLICATIONS OF TREATMENT: none  FOLLOW UP COMPLIANCE: keeps appointments   PHYSICAL EXAM:  BP (!) 125/56 (BP Location: Left Arm, Patient Position: Sitting)   Pulse 76   Temp (!) 96.5 F (35.8 C) (Tympanic)   Resp 16   Wt 124 lb 11.2 oz (56.6 kg)   BMI 22.81 kg/m  Lungs are clear to A&P cardiac examination essentially unremarkable with regular rate and rhythm. No dominant mass or nodularity is noted in either breast in 2 positions examined. Incision is well-healed. No axillary or supraclavicular adenopathy is appreciated. Cosmetic result is excellent.  Well-developed well-nourished patient in NAD. HEENT reveals PERLA, EOMI, discs not visualized.  Oral cavity is clear. No oral mucosal lesions are identified. Neck is clear without evidence of cervical or supraclavicular adenopathy. Lungs are clear to A&P. Cardiac examination is essentially unremarkable with regular rate and rhythm without murmur rub or thrill. Abdomen is benign with no organomegaly or masses noted. Motor sensory and DTR levels are equal and symmetric in the upper and lower extremities. Cranial nerves II through XII are grossly intact. Proprioception is intact. No peripheral adenopathy or edema is  identified. No motor or sensory levels are noted. Crude visual fields are within normal range.  RADIOLOGY RESULTS: Mammograms reviewed compatible with above-stated findings  PLAN: Present time patient continues to do well with no evidence of disease 18 months out from whole breast radiation and pleased with her overall progress.  I have asked to see her back in 1 year for follow-up.  Patient continues on Arimidex without side effect.  Patient knows to call with any concerns.  I would like to take this opportunity to thank you for allowing me to participate in the care of your patient..Emma Filbert MD

## 2020-02-24 IMAGING — MG STEREOTACTIC VACUUM ASSIST RIGHT
8 of 10 series · 8 of 18 positions shown · non-contrast
Comparison: Previous exams.

ADDENDUM:
Pathology of the right breast biopsy revealed A. BREAST
CALCIFICATIONS, RIGHT UPPER OUTER QUADRANT; STEREOTACTIC BIOPSY:
DUCTAL CARCINOMA IN SITU, INTERMEDIATE GRADE, WITHOUT
COMEDONECROSIS. CALCIFICATIONS ASSOCIATED WITH DCIS AND BLOOD
VESSELS. Comment: DCIS is present in 3 of 4 blocks with the largest
linear focus measuring 5 mm.

Immunohistochemistry for ER and PR will be performed on block A1 and
reported in an addendum. These findings were communicated to
Rojer in Dr. [REDACTED] on 01/25/2018. Read back
procedure was performed.
This was found to be concordant with Dr. Park Stoehr impression and
notes.
Recommendation: Surgical and oncology referrals.
Results and recommendations were relayed to Park Stoehr nurse in
Dr. [REDACTED]. She has the pathology report and will give it
to Dr. Tiger, [place]. He will contact the patient and make the referral.
Per notes in [REDACTED], the patient has been notified of the results and
has an appointment to see Dr. Paez on 02/16/18.
Addendum by Mayk on 01/27/18.
CLINICAL DATA: Right breast upper outer quadrant calcifications.
EXAM:
RIGHT BREAST STEREOTACTIC CORE NEEDLE BIOPSY

[R (1 of 3)]
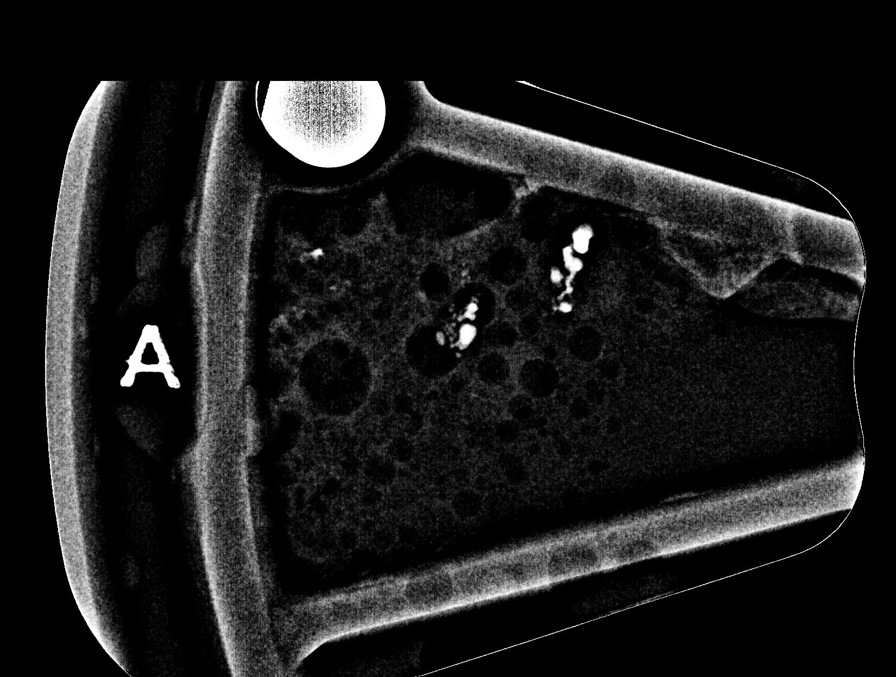

[R (2 of 3)]
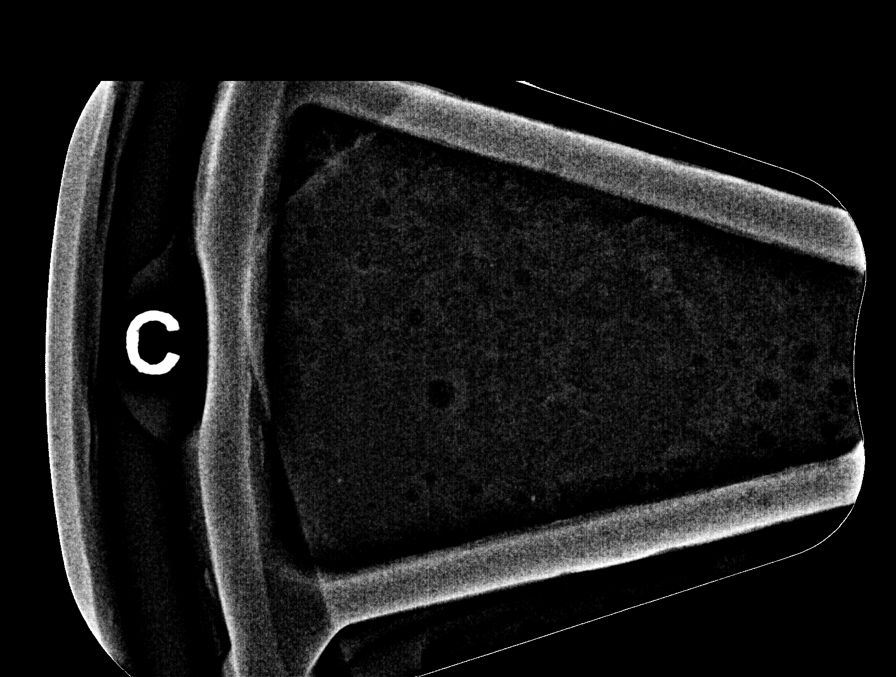

[R (3 of 3)]
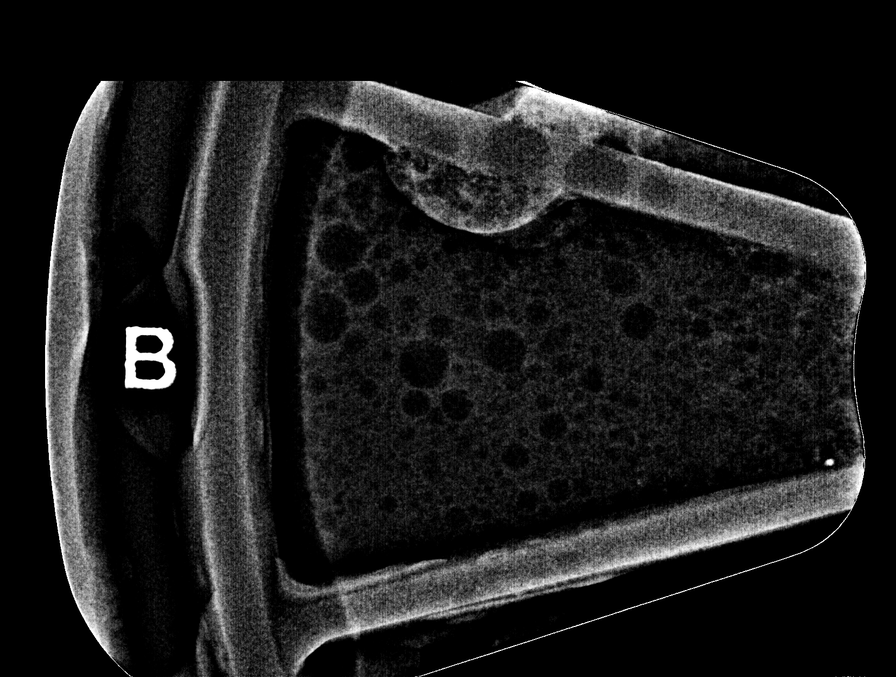

[R CC (1 of 5)]
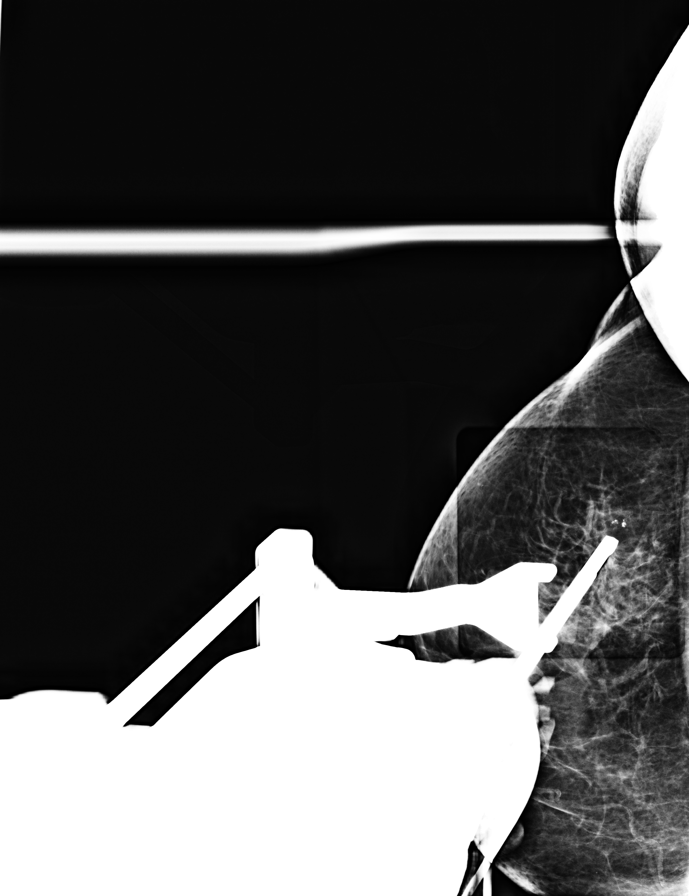

[R CC (2 of 5)]
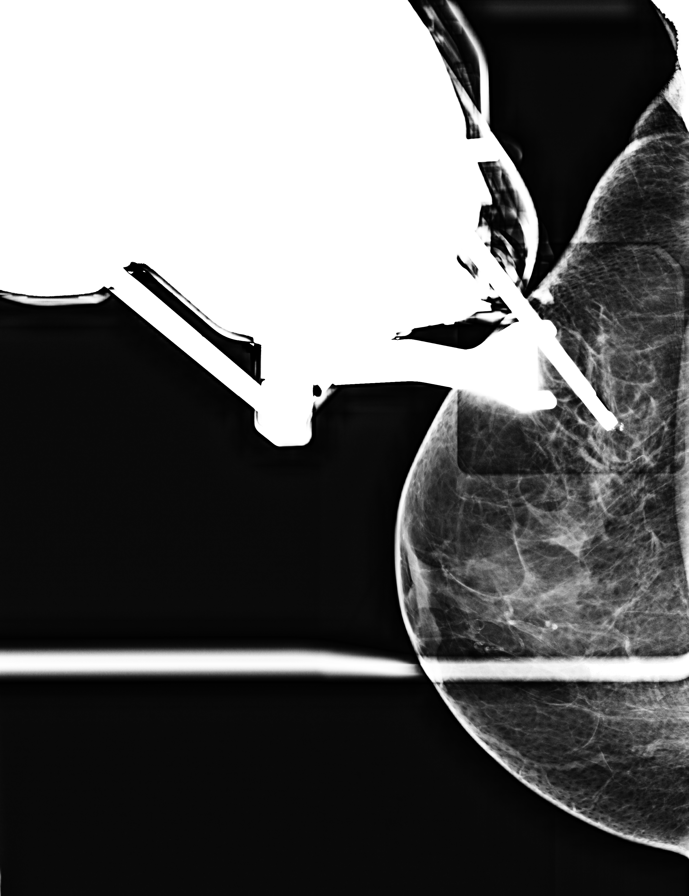

[R CC (3 of 5)]
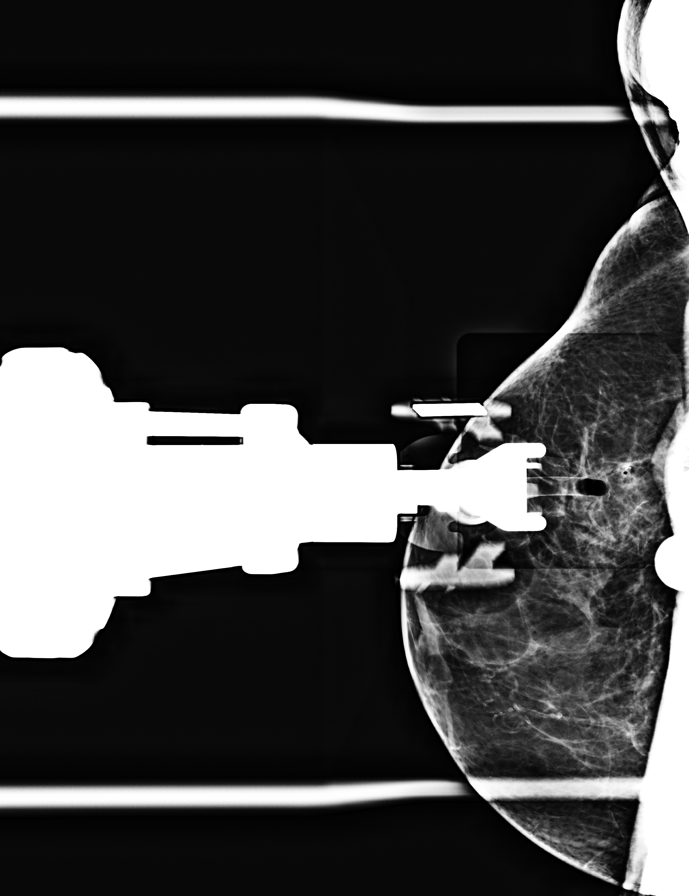

[R CC (4 of 5)]
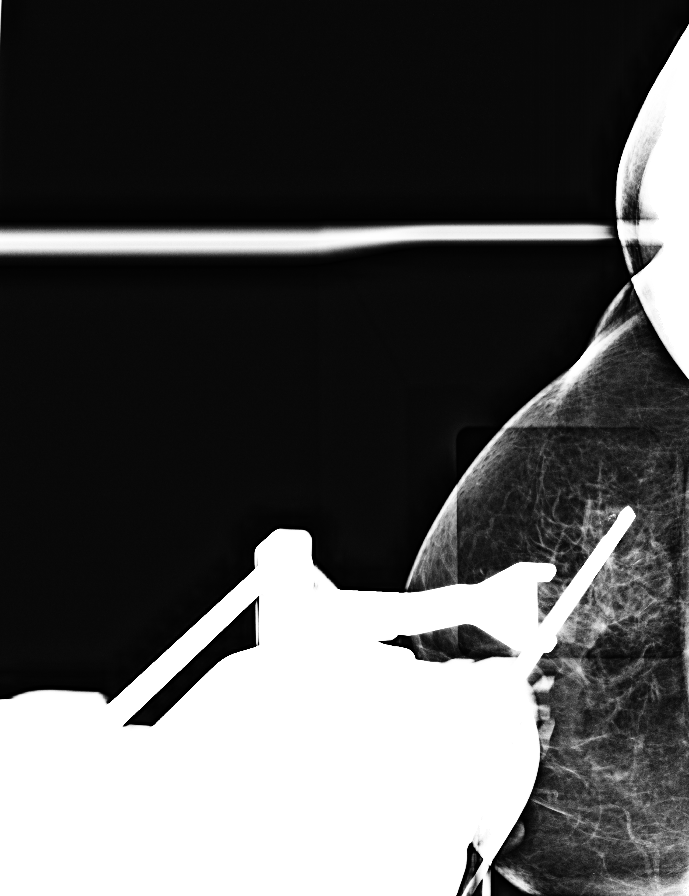

[R CC (5 of 5)]
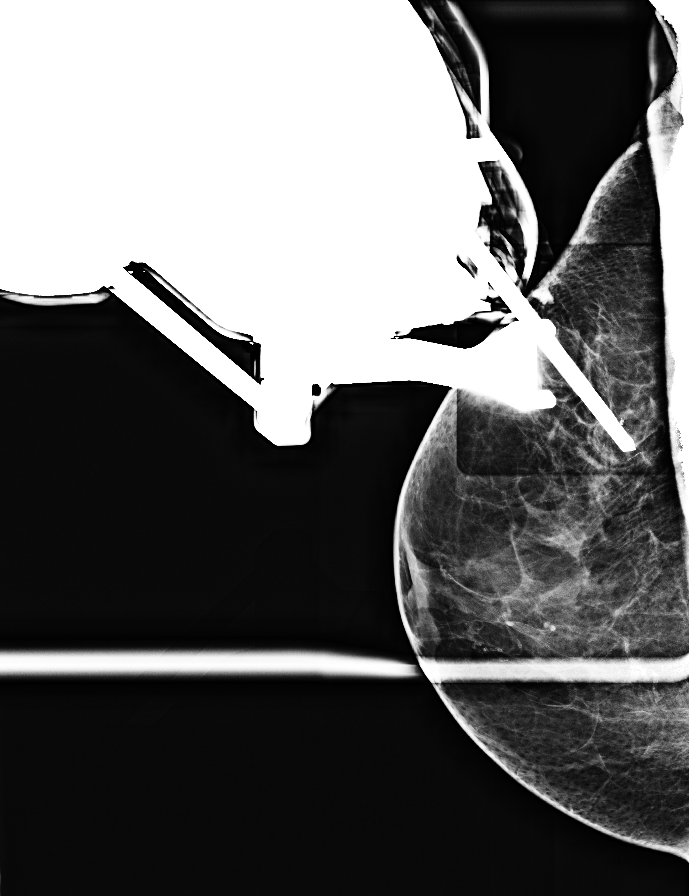

[8 of 18 positions shown; findings below may reference images not displayed]



Using sterile technique and 1% Lidocaine as local anesthetic, under
stereotactic guidance, a 9 gauge vacuum assisted device was used to
perform core needle biopsy of calcifications in the right breast
upper outer quadrant using a superior approach. Specimen radiograph
was performed showing presence of calcifications. Specimens with
calcifications are identified for pathology.

Lesion quadrant: Upper outer quadrant

At the conclusion of the procedure, a X shaped tissue marker clip
was deployed into the biopsy cavity. Follow-up 2-view mammogram was
performed and dictated separately.
IMPRESSION: Stereotactic-guided biopsy of right breast. No apparent
complications.

## 2020-05-06 ENCOUNTER — Other Ambulatory Visit: Payer: Self-pay

## 2020-05-06 ENCOUNTER — Ambulatory Visit: Payer: Medicare Other | Admitting: Dermatology

## 2020-05-06 ENCOUNTER — Encounter: Payer: Self-pay | Admitting: Dermatology

## 2020-05-06 DIAGNOSIS — L738 Other specified follicular disorders: Secondary | ICD-10-CM | POA: Diagnosis not present

## 2020-05-06 DIAGNOSIS — L578 Other skin changes due to chronic exposure to nonionizing radiation: Secondary | ICD-10-CM

## 2020-05-06 DIAGNOSIS — B351 Tinea unguium: Secondary | ICD-10-CM | POA: Diagnosis not present

## 2020-05-06 DIAGNOSIS — Z1283 Encounter for screening for malignant neoplasm of skin: Secondary | ICD-10-CM

## 2020-05-06 DIAGNOSIS — Z85828 Personal history of other malignant neoplasm of skin: Secondary | ICD-10-CM

## 2020-05-06 DIAGNOSIS — D229 Melanocytic nevi, unspecified: Secondary | ICD-10-CM

## 2020-05-06 DIAGNOSIS — L821 Other seborrheic keratosis: Secondary | ICD-10-CM

## 2020-05-06 DIAGNOSIS — B079 Viral wart, unspecified: Secondary | ICD-10-CM | POA: Diagnosis not present

## 2020-05-06 DIAGNOSIS — L814 Other melanin hyperpigmentation: Secondary | ICD-10-CM

## 2020-05-06 DIAGNOSIS — D1800 Hemangioma unspecified site: Secondary | ICD-10-CM

## 2020-05-06 DIAGNOSIS — L820 Inflamed seborrheic keratosis: Secondary | ICD-10-CM

## 2020-05-06 DIAGNOSIS — L570 Actinic keratosis: Secondary | ICD-10-CM | POA: Diagnosis not present

## 2020-05-06 NOTE — Patient Instructions (Signed)
Terbinafine Counseling  Terbinafine is an anti-fungal medicine that can be applied to the skin (over the counter) or taken by mouth (prescription) to treat fungal infections. The pill version is often used to treat fungal infections of the nails or scalp. While most people do not have any side effects from taking terbinafine pills, some possible side effects of the medicine can include taste changes, headache, loss of smell, vision changes, nausea, vomiting, or diarrhea.   Rare side effects can include irritation of the liver, allergic reaction, or decrease in blood counts (which may show up as not feeling well or developing an infection). If you are concerned about any of these side effects, please stop the medicine and call your doctor, or in the case of an emergency such as feeling very unwell, seek immediate medical care.   Viral Warts & Molluscum Contagiosum  Viral warts and molluscum contagiosum are growths of the skin caused by viral infection of the skin. If you have been given the diagnosis of viral warts or molluscum contagiosum there are a few things that you must understand about your condition:  1. There is no guaranteed treatment method available for this condition. 2. Multiple treatments may be required, 3. The treatments may be time consuming and require multiple visits to the dermatology office. 4. The treatment may be expensive. You will be charged each time you come into the office to have the spots treated. 5. The treated areas may develop new lesions further complicating treatment. 6. The treated areas may leave a scar. 7. There is no guarantee that even after multiple treatments that the spots will be successfully treated. 8. These are caused by a viral infection and can be spread to other areas of the skin and to other people by direct contact. Therefore, new spots may occur.

## 2020-05-06 NOTE — Progress Notes (Signed)
New Patient Visit  Subjective  Emma Aguirre is a 85 y.o. female who presents for the following: Annual Exam (Hx BCC on the face and right leg). The patient presents for Total-Body Skin Exam (TBSE) for skin cancer screening and mole check. Patient would like to discuss treatment options for toenail fungus.   The following portions of the chart were reviewed this encounter and updated as appropriate:   Tobacco  Allergies  Meds  Problems  Med Hx  Surg Hx  Fam Hx     Review of Systems:  No other skin or systemic complaints except as noted in HPI or Assessment and Plan.  Objective  Well appearing patient in no apparent distress; mood and affect are within normal limits.  A full examination was performed including scalp, head, eyes, ears, nose, lips, neck, chest, axillae, abdomen, back, buttocks, bilateral upper extremities, bilateral lower extremities, hands, feet, fingers, toes, fingernails, and toenails. All findings within normal limits unless otherwise noted below.  Objective  B/L toenails: Yellow thickened toenails with subungual debris of the right foot.  Objective  L cheek x 1, R cheek x 1: Erythematous thin papules/macules with gritty scale.   Objective  Face: Small yellow papules with a central dell.   Objective  Left Popliteal Fossa: 0.9 cm thin scaly erythematous papule  Objective  Left Medial Thigh: Verrucous papules  Assessment & Plan  Tinea unguium B/L toenails  Chronic condition with duration over one year. Condition is bothersome to patient. Currently flared.  Reviewed CBC with diff and CMP - WNL  Pending fungal culture, plan Terbinafine Pulse dosing QD x 7 days - repeat every other month for 7 months if fungal culture positive.   Terbinafine Counseling  Terbinafine is an anti-fungal medicine that can be applied to the skin (over the counter) or taken by mouth (prescription) to treat fungal infections. The pill version is often used to treat fungal  infections of the nails or scalp. While most people do not have any side effects from taking terbinafine pills, some possible side effects of the medicine can include taste changes, headache, loss of smell, vision changes, nausea, vomiting, or diarrhea.   Rare side effects can include irritation of the liver, allergic reaction, or decrease in blood counts (which may show up as not feeling well or developing an infection). If you are concerned about any of these side effects, please stop the medicine and call your doctor, or in the case of an emergency such as feeling very unwell, seek immediate medical care.    Culture, Fungus with Smear - B/L toenails  AK (actinic keratosis) L cheek x 1, R cheek x 1  Prior to procedure, discussed risks of blister formation, small wound, skin dyspigmentation, or rare scar following cryotherapy.    Destruction of lesion - L cheek x 1, R cheek x 1 Complexity: simple   Destruction method: cryotherapy   Informed consent: discussed and consent obtained   Timeout:  patient name, date of birth, surgical site, and procedure verified Lesion destroyed using liquid nitrogen: Yes   Region frozen until ice ball extended beyond lesion: Yes   Outcome: patient tolerated procedure well with no complications   Post-procedure details: wound care instructions given    Sebaceous hyperplasia of face Face  Benign-appearing.  Observation.  Call clinic for new or changing lesions.  Recommend daily use of broad spectrum spf 30+ sunscreen to sun-exposed areas.    Inflamed seborrheic keratosis Left Popliteal Fossa  Benign-appearing.  Call clinic  for new or changing lesions.  Recommend daily use of broad spectrum spf 30+ sunscreen to sun-exposed areas.   Will recheck in 3 months   Viral warts, unspecified type Left Medial Thigh  Discussed viral etiology and risk of spread.  Discussed multiple treatments may be required to clear warts.  Discussed possible post-treatment  dyspigmentation and risk of recurrence.  Cantharidin is a blistering agent that comes from a beetle.  It needs to be washed off in about 4 hours after application.  Although it is painless when applied in office, it may cause symptoms of mild pain and burning several hours later.  Treated areas will swell and turn red, and blisters may form.  Vaseline and a bandaid may be applied until wound has healed.  Once healed, the skin may remain temporarily discolored.  It can take weeks to months for pigmentation to return to normal.    Destruction of lesion - Left Medial Thigh Complexity: simple   Destruction method: chemical removal   Informed consent: discussed and consent obtained   Timeout:  patient name, date of birth, surgical site, and procedure verified Chemical destruction method: cantharidin   Application time:  4 hours Procedure instructions: patient instructed to wash and dry area   Outcome: patient tolerated procedure well with no complications     Lentigines - Scattered tan macules - Discussed due to sun exposure - Benign, observe - Call for any changes  Seborrheic Keratoses - Stuck-on, waxy, tan-brown papules and plaques  - Discussed benign etiology and prognosis. - Observe - Call for any changes  Melanocytic Nevi - Tan-brown and/or pink-flesh-colored symmetric macules and papules - Benign appearing on exam today - Observation - Call clinic for new or changing moles - Recommend daily use of broad spectrum spf 30+ sunscreen to sun-exposed areas.   Hemangiomas - Red papules - Discussed benign nature - Observe - Call for any changes  Actinic Damage - Chronic, secondary to cumulative UV/sun exposure - diffuse scaly erythematous macules with underlying dyspigmentation - Recommend daily broad spectrum sunscreen SPF 30+ to sun-exposed areas, reapply every 2 hours as needed.  - Call for new or changing lesions.  History of Basal Cell Carcinoma of the Skin - No evidence  of recurrence today - Recommend regular full body skin exams - Recommend daily broad spectrum sunscreen SPF 30+ to sun-exposed areas, reapply every 2 hours as needed.  - Call if any new or changing lesions are noted between office visits  Skin cancer screening performed today.  Return in about 3 months (around 08/03/2020) for F/U appt. - AK and ISK recheck.  Luther Redo, CMA, am acting as scribe for Forest Gleason, MD .  Documentation: I have reviewed the above documentation for accuracy and completeness, and I agree with the above.  Forest Gleason, MD

## 2020-05-07 ENCOUNTER — Other Ambulatory Visit: Payer: Self-pay | Admitting: Dermatology

## 2020-05-19 ENCOUNTER — Encounter: Payer: Self-pay | Admitting: Dermatology

## 2020-05-29 ENCOUNTER — Ambulatory Visit: Payer: Medicare Other | Admitting: Dermatology

## 2020-06-03 LAB — FUNGUS CULTURE W SMEAR

## 2020-06-05 ENCOUNTER — Other Ambulatory Visit: Payer: Self-pay

## 2020-06-05 DIAGNOSIS — M8580 Other specified disorders of bone density and structure, unspecified site: Secondary | ICD-10-CM

## 2020-06-05 DIAGNOSIS — Z86000 Personal history of in-situ neoplasm of breast: Secondary | ICD-10-CM

## 2020-06-06 ENCOUNTER — Inpatient Hospital Stay: Payer: Medicare Other

## 2020-06-06 ENCOUNTER — Inpatient Hospital Stay: Payer: Medicare Other | Attending: Oncology

## 2020-06-06 ENCOUNTER — Other Ambulatory Visit: Payer: Self-pay

## 2020-06-06 ENCOUNTER — Inpatient Hospital Stay (HOSPITAL_BASED_OUTPATIENT_CLINIC_OR_DEPARTMENT_OTHER): Payer: Medicare Other | Admitting: Oncology

## 2020-06-06 ENCOUNTER — Encounter: Payer: Self-pay | Admitting: Oncology

## 2020-06-06 VITALS — BP 144/78 | HR 64 | Temp 97.8°F | Resp 16 | Wt 123.0 lb

## 2020-06-06 DIAGNOSIS — M1990 Unspecified osteoarthritis, unspecified site: Secondary | ICD-10-CM | POA: Insufficient documentation

## 2020-06-06 DIAGNOSIS — E785 Hyperlipidemia, unspecified: Secondary | ICD-10-CM | POA: Diagnosis not present

## 2020-06-06 DIAGNOSIS — Z170 Estrogen receptor positive status [ER+]: Secondary | ICD-10-CM | POA: Diagnosis not present

## 2020-06-06 DIAGNOSIS — M816 Localized osteoporosis [Lequesne]: Secondary | ICD-10-CM | POA: Diagnosis not present

## 2020-06-06 DIAGNOSIS — Z86000 Personal history of in-situ neoplasm of breast: Secondary | ICD-10-CM

## 2020-06-06 DIAGNOSIS — Z79811 Long term (current) use of aromatase inhibitors: Secondary | ICD-10-CM | POA: Diagnosis not present

## 2020-06-06 DIAGNOSIS — D0511 Intraductal carcinoma in situ of right breast: Secondary | ICD-10-CM | POA: Insufficient documentation

## 2020-06-06 DIAGNOSIS — Z79899 Other long term (current) drug therapy: Secondary | ICD-10-CM | POA: Diagnosis not present

## 2020-06-06 DIAGNOSIS — Z923 Personal history of irradiation: Secondary | ICD-10-CM | POA: Insufficient documentation

## 2020-06-06 DIAGNOSIS — Z85828 Personal history of other malignant neoplasm of skin: Secondary | ICD-10-CM | POA: Diagnosis not present

## 2020-06-06 DIAGNOSIS — M8580 Other specified disorders of bone density and structure, unspecified site: Secondary | ICD-10-CM

## 2020-06-06 LAB — CBC WITH DIFFERENTIAL/PLATELET
Abs Immature Granulocytes: 0.02 10*3/uL (ref 0.00–0.07)
Basophils Absolute: 0.1 10*3/uL (ref 0.0–0.1)
Basophils Relative: 1 %
Eosinophils Absolute: 0.2 10*3/uL (ref 0.0–0.5)
Eosinophils Relative: 4 %
HCT: 36.2 % (ref 36.0–46.0)
Hemoglobin: 11.9 g/dL — ABNORMAL LOW (ref 12.0–15.0)
Immature Granulocytes: 0 %
Lymphocytes Relative: 20 %
Lymphs Abs: 1.2 10*3/uL (ref 0.7–4.0)
MCH: 31.1 pg (ref 26.0–34.0)
MCHC: 32.9 g/dL (ref 30.0–36.0)
MCV: 94.5 fL (ref 80.0–100.0)
Monocytes Absolute: 0.7 10*3/uL (ref 0.1–1.0)
Monocytes Relative: 12 %
Neutro Abs: 3.9 10*3/uL (ref 1.7–7.7)
Neutrophils Relative %: 63 %
Platelets: 338 10*3/uL (ref 150–400)
RBC: 3.83 MIL/uL — ABNORMAL LOW (ref 3.87–5.11)
RDW: 12.8 % (ref 11.5–15.5)
WBC: 6.2 10*3/uL (ref 4.0–10.5)
nRBC: 0 % (ref 0.0–0.2)

## 2020-06-06 LAB — COMPREHENSIVE METABOLIC PANEL
ALT: 18 U/L (ref 0–44)
AST: 23 U/L (ref 15–41)
Albumin: 4.1 g/dL (ref 3.5–5.0)
Alkaline Phosphatase: 54 U/L (ref 38–126)
Anion gap: 8 (ref 5–15)
BUN: 22 mg/dL (ref 8–23)
CO2: 27 mmol/L (ref 22–32)
Calcium: 9.2 mg/dL (ref 8.9–10.3)
Chloride: 104 mmol/L (ref 98–111)
Creatinine, Ser: 0.67 mg/dL (ref 0.44–1.00)
GFR, Estimated: >60 mL/min (ref >60)
Glucose, Bld: 88 mg/dL (ref 70–99)
Potassium: 4 mmol/L (ref 3.5–5.1)
Sodium: 139 mmol/L (ref 135–145)
Total Bilirubin: 0.6 mg/dL (ref 0.3–1.2)
Total Protein: 6.4 g/dL — ABNORMAL LOW (ref 6.5–8.1)

## 2020-06-06 MED ORDER — SODIUM CHLORIDE 0.9 % IV SOLN
Freq: Once | INTRAVENOUS | Status: AC
  Filled 2020-06-06: qty 250

## 2020-06-06 MED ORDER — ZOLEDRONIC ACID 4 MG/5ML IV CONC
3.0000 mg | Freq: Once | INTRAVENOUS | Status: AC
  Administered 2020-06-06: 3 mg via INTRAVENOUS
  Filled 2020-06-06: qty 3.75

## 2020-06-06 NOTE — Progress Notes (Signed)
Hematology/Oncology Consult note [facility] [phone] [phone]   Patient Care Team: Rusty Aus, MD as PCP - General (Internal Medicine)  REFERRING PROVIDER: Rusty Aus, MD REASON FOR VISIT:  Follow-up and management for DCIS.  HISTORY OF PRESENTING ILLNESS:  Emma Aguirre is a  85 y.o.  female with PMH listed below who was referred to me for evaluation of DCIS.  Patient is a retired Therapist, sports.  Patient had diagnostic mammogram right, on 01/13/2018 to follow up right upper quadrant calcifications.  Mammogram showed right upper quadrant 12m group of calcification, for which stereotactic core needle biopsy was performed.   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    Family history: denies any breast cancer or ovarian cancer OCP use: report remote use of OCP Estrogen and progesterone therapy: endorses history of hormone replacement therapy History of radiation to chest: denies.  Denies any breast pain, nipple discharge, breast skin changes.  #Treatment - She underwent lumpectomy of right breast on 05/02/2018. 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  Clip and biopsy site present.  Background fibrocystic changes.  No evidence of invasive carcinoma.  Size of DCIS at least 5 mm.  Margins uninvolved by DCIS.  Closest margin 6 mm. pTis(DCIS) pNx.  Hormone receptor studies were performed on previous biopsy specimen. ER> 90%, PR 51 to 90% -March 2020 finished adjuvant radiation. -April 2020 started on Arimidex 1 mg daily. -[facility]eta every 6 months.  Discussed about other options about switching to tamoxifen and patient prefers to stay on aromatase inhibitor.   INTERVAL HISTORY JIlyssa Grennanis a 85y.o. female who has above history reviewed by me today presents for follow up visit for management of DCIS,  status post lumpectomy 05/02/2018 Patient takes Arimidex. Manageable side effects. Osteoporosis on Zometa treatments every 6 months. Tolerates well.. Today she reports no new complaints. Patient takes biotin and hair loss has improved. Review of Systems  Constitutional: Negative for chills, fever, malaise/fatigue and weight loss.  HENT: Negative for nosebleeds and sore throat.   Eyes: Negative for double vision, photophobia and redness.  Respiratory: Negative for cough, shortness of breath and wheezing.   Cardiovascular: Negative for chest pain, palpitations, orthopnea and leg swelling.  Gastrointestinal: Negative for abdominal pain, blood in stool, nausea and vomiting.  Genitourinary: Negative for dysuria.  Musculoskeletal: Positive for joint pain. Negative for back pain and neck pain.  Skin: Negative for itching and rash.  Neurological: Negative for dizziness, tingling and tremors.  Endo/Heme/Allergies: Negative for environmental allergies. Does not bruise/bleed easily.  Psychiatric/Behavioral: Negative for depression and hallucinations.    MEDICAL HISTORY:  Past Medical History:  Diagnosis Date  . Anemia    RELATED TO CELIAC DX  . Arthritis   . Basal cell carcinoma    on the face and legs - about 15 years ago   . Breast cancer ([facility] 2019  . Celiac disease   . DCIS (ductal carcinoma in situ) 01/23/2018   right breast DUCTAL CARCINOMA IN SITU, INTERMEDIATE GRADE, WITHOUT COMEDONECROSIS  . Hyperlipidemia   . Personal history of radiation therapy     SURGICAL HISTORY: Past Surgical History:  Procedure Laterality Date  . BREAST BIOPSY Right 01/23/2018   stereo calcs  x clip, DUCTAL CARCINOMA IN SITU, INTERMEDIATE GRADE, WITHOUT COMEDONECROSIS  . BREAST EXCISIONAL BIOPSY    . BREAST LUMPECTOMY Right 05/02/2018  . BREAST LUMPECTOMY WITH NEEDLE LOCALIZATION  AND AXILLARY SENTINEL LYMPH NODE BX Right 05/02/2018   Procedure: BREAST LUMPECTOMY WITH NEEDLE LOCALIZATION AND POSSIBLE  SENTINEL NODE BX;  Surgeon: Benjamine Sprague, DO;  Location: [facility] ORS;  Service: General;  Laterality: Right;  . TUBAL LIGATION      SOCIAL HISTORY: Social History   Socioeconomic History  . Marital status: Married    Spouse name: Not on file  . Number of children: Not on file  . Years of education: Not on file  . Highest education level: Not on file  Occupational History  . Not on file  Tobacco Use  . Smoking status: Never Smoker  . Smokeless tobacco: Never Used  Vaping Use  . Vaping Use: Never used  Substance and Sexual Activity  . Alcohol use: Yes    Comment: 1 GLASS WINE A WEEK   . Drug use: Never  . Sexual activity: Not on file  Other Topics Concern  . Not on file  Social History Narrative  . Not on file   Social Determinants of Health   Financial Resource Strain: Not on file  Food Insecurity: Not on file  Transportation Needs: Not on file  Physical Activity: Not on file  Stress: Not on file  Social Connections: Not on file  Intimate Partner Violence: Not on file    FAMILY HISTORY: Family History  Problem Relation Age of Onset  . Non-Hodgkin's lymphoma Mother   . Heart attack Father   . Hypertension Brother   . Breast cancer Neg Hx   . Colon cancer Neg Hx     ALLERGIES:  is allergic to wasp venom protein.  MEDICATIONS:  Current Outpatient Medications  Medication Sig Dispense Refill  . anastrozole (ARIMIDEX) 1 MG tablet TAKE 1 TABLET BY MOUTH  DAILY 90 tablet 3  . Biotin 5 MG TBDP Take 5,000 mcg by mouth daily.    . calcium citrate-vitamin D (CITRACAL+D) 315-200 MG-UNIT tablet Take by mouth.    . Cholecalciferol 25 MCG (1000 UT) tablet Take 1,000 Units by mouth daily.    . cyanocobalamin 1000 MCG tablet Take 2,500 mcg by mouth.     . Magnesium 250 MG TABS Take 250 mg by mouth at bedtime as needed.     . naproxen sodium (ALEVE) 220 MG tablet Take by mouth.    . simvastatin (ZOCOR) 40 MG tablet Take 40 mg by mouth every evening.     . vitamin C (ASCORBIC  ACID) 250 MG tablet Take 250 mg by mouth daily.     No current facility-administered medications for this visit.     PHYSICAL EXAMINATION: ECOG PERFORMANCE STATUS: 1 - Symptomatic but completely ambulatory Vitals:   06/06/20 1400  BP: (!) 144/78  Pulse: 64  Resp: 16  Temp: 97.8 F (36.6 C)   Filed Weights   06/06/20 1400  Weight: 123 lb (55.8 kg)    Physical Exam Constitutional:      General: She is not in acute distress. HENT:     Head: Normocephalic and atraumatic.  Eyes:     General: No scleral icterus.    Pupils: Pupils are equal, round, and reactive to light.  Cardiovascular:     Rate and Rhythm: Normal rate and regular rhythm.     Heart sounds: Normal heart sounds.  Pulmonary:     Effort: Pulmonary effort is normal. No respiratory distress.     Breath sounds: No wheezing.  Abdominal:     General: Bowel sounds are normal.     Palpations: Abdomen  is soft.  Musculoskeletal:        General: No deformity. Normal range of motion.     Cervical back: Normal range of motion and neck supple.  Skin:    General: Skin is warm and dry.     Findings: No erythema or rash.  Neurological:     Mental Status: She is alert and oriented to person, place, and time. Mental status is at baseline.     Cranial Nerves: No cranial nerve deficit.     Coordination: Coordination normal.  Psychiatric:        Mood and Affect: Mood normal.       LABORATORY DATA:  I have reviewed the data as listed Lab Results  Component Value Date   WBC 6.2 06/06/2020   HGB 11.9 (L) 06/06/2020   HCT 36.2 06/06/2020   MCV 94.5 06/06/2020   PLT 338 06/06/2020   Recent Labs    12/07/19 1329 06/06/20 1315  NA 138 139  K 4.4 4.0  CL 102 104  CO2 27 27  GLUCOSE 81 88  BUN 27* 22  CREATININE 0.82 0.67  CALCIUM 9.5 9.2  GFRNONAA >60 >60  GFRAA >60  --   PROT 6.5 6.4*  ALBUMIN 4.1 4.1  AST 19 23  ALT 18 18  ALKPHOS 58 54  BILITOT 0.6 0.6   Iron/TIBC/Ferritin/ %Sat No results found  for: IRON, TIBC, FERRITIN, IRONPCTSAT   Reviewed patient's lab done at [facility] clinic on 10/07/2017  Cbc showed hemoglobin 12.6, wbc 6.2, platelet 315 CMP showed sodium 142, potassium 4.5, creatinine 0.8, calcium 9.6, AST 20, ALT 16, bilirubin 0.4  ASSESSMENT & PLAN:  1. History of ductal carcinoma in situ (DCIS) of breast   2. Localized osteoporosis without current pathological fracture    #History of DCIS Labs are reviewed and are discussed with patient Continue Arimidex 1 mg daily. She tolerates well. Due for bilateral diagnostic mammogram in June 2022. Ordered.  Osteoporosis, in the context of aromatase inhibitor use. Continue calcium and vitamin D supplementation. Proceed with Zometa 60m every 6 months. CT for Zometa today.  Follow-up in 6 months.  ZEarlie Server MD, PhD Hematology Oncology [facility] LtdPager- [phone]/07/2020

## 2020-06-06 NOTE — Progress Notes (Signed)
Patient reports having trouble sleeping and noticed an increase in fingernails splitting since taking anastrozole.

## 2020-06-12 ENCOUNTER — Telehealth: Payer: Self-pay

## 2020-06-12 NOTE — Telephone Encounter (Signed)
Called patient and reviewed results and plan of treatment with terbinafine pulse dosing. Patient would like to wait and research the treatment plan. I have forwarded her the below information for her review. She will call the office or send message via MyChart if she decides she would like to start terbinafine.   Culture grew Trichophyton rubrum.   Recommend pulse dose terbinafine to be taken for 7 days every other month for 7 months (Take for 7 days at the beginnings of months 1, 3, 5, and 7; dispense 28 tablets).    Terbinafine is an anti-fungal medicine that can be applied to the skin (over the counter) or taken by mouth (prescription) to treat fungal infections. The pill version is often used to treat fungal infections of the nails or scalp. While most people do not have any side effects from taking terbinafine pills, some possible side effects of the medicine can include taste changes, headache, loss of smell, vision changes, nausea, vomiting, or diarrhea.    Rare side effects can include irritation of the liver, allergic reaction, or decrease in blood counts (which may show up as not feeling well or developing an infection). If you are concerned about any of these side effects, please stop the medicine and call your doctor, or in the case of an emergency such as feeling very unwell, seek immediate medical care.

## 2020-06-12 NOTE — Telephone Encounter (Signed)
-----   Message from Alfonso Patten, MD sent at 06/10/2020  3:51 PM EST ----- Culture grew Trichophyton rubrum.   Recommend pulse dose terbinafine to be taken for 7 days every other month for 7 months (Take for 7 days at the beginnings of months 1, 3, 5, and 7; dispense 28 tablets).  Terbinafine is an anti-fungal medicine that can be applied to the skin (over the counter) or taken by mouth (prescription) to treat fungal infections. The pill version is often used to treat fungal infections of the nails or scalp. While most people do not have any side effects from taking terbinafine pills, some possible side effects of the medicine can include taste changes, headache, loss of smell, vision changes, nausea, vomiting, or diarrhea.   Rare side effects can include irritation of the liver, allergic reaction, or decrease in blood counts (which may show up as not feeling well or developing an infection). If you are concerned about any of these side effects, please stop the medicine and call your doctor, or in the case of an emergency such as feeling very unwell, seek immediate medical care.   MAs please call. Thank you!

## 2020-06-16 ENCOUNTER — Other Ambulatory Visit: Payer: Self-pay

## 2020-06-16 DIAGNOSIS — B351 Tinea unguium: Secondary | ICD-10-CM

## 2020-06-16 MED ORDER — TERBINAFINE HCL 250 MG PO TABS: 250.0000 mg | ORAL_TABLET | Freq: Every day | ORAL | 0 refills | Status: DC

## 2020-07-02 ENCOUNTER — Telehealth: Payer: Self-pay

## 2020-07-02 NOTE — Telephone Encounter (Signed)
Patient called in with one question regarding the Terbinafine. She was doing some online searching regarding medication and found you do not need to eat or drink anything with caffeine while taking this medication. Patient is asking does this requirement mean for the 7 days she is taking the medication or the entire treatment course.

## 2020-07-02 NOTE — Telephone Encounter (Signed)
This is usually only a concern if someone is using a lot of caffeine or is sensitive to it. If she is concerned, she can avoid caffeine or cut back to less than her normal just on the weeks she is taking the medication. Thank you

## 2020-07-02 NOTE — Telephone Encounter (Signed)
Patient advised of information per Dr. Laurence Ferrari.

## 2020-08-07 ENCOUNTER — Ambulatory Visit: Payer: Medicare Other | Admitting: Dermatology

## 2020-08-07 ENCOUNTER — Other Ambulatory Visit: Payer: Self-pay

## 2020-08-07 DIAGNOSIS — L738 Other specified follicular disorders: Secondary | ICD-10-CM

## 2020-08-07 DIAGNOSIS — L578 Other skin changes due to chronic exposure to nonionizing radiation: Secondary | ICD-10-CM

## 2020-08-07 DIAGNOSIS — D0472 Carcinoma in situ of skin of left lower limb, including hip: Secondary | ICD-10-CM | POA: Diagnosis not present

## 2020-08-07 DIAGNOSIS — B079 Viral wart, unspecified: Secondary | ICD-10-CM

## 2020-08-07 DIAGNOSIS — Z86007 Personal history of in-situ neoplasm of skin: Secondary | ICD-10-CM

## 2020-08-07 DIAGNOSIS — Z872 Personal history of diseases of the skin and subcutaneous tissue: Secondary | ICD-10-CM

## 2020-08-07 DIAGNOSIS — D489 Neoplasm of uncertain behavior, unspecified: Secondary | ICD-10-CM

## 2020-08-07 HISTORY — DX: Personal history of in-situ neoplasm of skin: Z86.007

## 2020-08-07 MED ORDER — MUPIROCIN 2 % EX OINT: 1.0000 "application " | TOPICAL_OINTMENT | Freq: Every day | CUTANEOUS | 0 refills | Status: DC

## 2020-08-07 NOTE — Patient Instructions (Addendum)
Biopsy Wound Care Instructions  1. Leave the original bandage on for 24 hours if possible.  If the bandage becomes soaked or soiled before that time, it is OK to remove it and examine the wound.  A small amount of post-operative bleeding is normal.  If excessive bleeding occurs, remove the bandage, place gauze over the site and apply continuous pressure (no peeking) over the area for 30 minutes. If this does not work, please call our clinic as soon as possible or page your doctor if it is after hours.   2. Once a day, cleanse the wound with soap and water. It is fine to shower. If a thick crust develops you may use a Q-tip dipped into dilute hydrogen peroxide (mix 1:1 with water) to dissolve it.  Hydrogen peroxide can slow the healing process, so use it only as needed.    3. After washing, apply petroleum jelly (Vaseline) or an antibiotic ointment if your doctor prescribed one for you, followed by a bandage.    4. For best healing, the wound should be covered with a layer of ointment at all times. If you are not able to keep the area covered with a bandage to hold the ointment in place, this may mean re-applying the ointment several times a day.  Continue this wound care until the wound has healed and is no longer open.   Itching and mild discomfort is normal during the healing process. However, if you develop pain or severe itching, please call our office.   If you have any discomfort, you can take Tylenol (acetaminophen) or ibuprofen as directed on the bottle. (Please do not take these if you have an allergy to them or cannot take them for another reason).  Some redness, tenderness and white or yellow material in the wound is normal healing.  If the area becomes very sore and red, or develops a thick yellow-green material (pus), it may be infected; please notify us.    If you have stitches, return to clinic as directed to have the stitches removed. You will continue wound care for 2-3 days after  the stitches are removed.   Wound healing continues for up to one year following surgery. It is not unusual to experience pain in the scar from time to time during the interval.  If the pain becomes severe or the scar thickens, you should notify the office.    A slight amount of redness in a scar is expected for the first six months.  After six months, the redness will fade and the scar will soften and fade.  The color difference becomes less noticeable with time.  If there are any problems, return for a post-op surgery check at your earliest convenience.  To improve the appearance of the scar, you can use silicone scar gel, cream, or sheets (such as Mederma or Serica) every night for up to one year. These are available over the counter (without a prescription).  Please call our office at [phone] for any questions or concerns.  For wound at leg  Apply mupirocin 2 % ointment apply daily and cover with band aid until healed.    Recommend daily broad spectrum sunscreen SPF 30+ to sun-exposed areas, reapply every 2 hours as needed. Call for new or changing lesions.  Staying in the shade or wearing long sleeves, sun glasses (UVA+UVB protection) and wide brim hats (4-inch brim around the entire circumference of the hat) are also recommended for sun protection.   Recommend taking  Heliocare sun protection supplement daily in sunny weather for additional sun protection. For maximum protection on the sunniest days, you can take up to 2 capsules of regular Heliocare OR take 1 capsule of Heliocare Ultra. For prolonged exposure (such as a full day in the sun), you can repeat your dose of the supplement 4 hours after your first dose. Heliocare can be purchased at [facility] or at [email or web address].   If you have any questions or concerns for your doctor, please call our main line at [phone] and press option 4 to reach your doctor's medical assistant. If no one answers, please leave a  voicemail as directed and we will return your call as soon as possible. Messages left after 4 pm will be answered the following business day.   You may also send [place] a message via [place]. We typically respond to MyChart messages within 1-2 business days.  For prescription refills, please ask your pharmacy to contact our office. Our fax number is [phone].  If you have an urgent issue when the clinic is closed that cannot wait until the next business day, you can page your doctor at the number below.    Please note that while we do our best to be available for urgent issues outside of office hours, we are not available 24/7.   If you have an urgent issue and are unable to reach [place], you may choose to seek medical care at your doctor's office, retail clinic, urgent care center, or emergency room.  If you have a medical emergency, please immediately call 911 or go to the emergency department.  Pager Numbers  - Dr. Nehemiah Massed: [phone]  - Dr. Laurence Ferrari: [phone]  - Dr. Nicole Kindred: [phone]  In the event of inclement weather, please call our main line at [phone] for an update on the status of any delays or closures.  Dermatology Medication Tips: Please keep the boxes that topical medications come in in order to help keep track of the instructions about where and how to use these. Pharmacies typically print the medication instructions only on the boxes and not directly on the medication tubes.   If your medication is too expensive, please contact our office at [phone] option 4 or send [place] a message through [place].   We are unable to tell what your co-pay for medications will be in advance as this is different depending on your insurance coverage. However, we may be able to find a substitute medication at lower cost or fill out paperwork to get insurance to cover a needed medication.   If a prior authorization is required to get your medication covered by your insurance  company, please allow [place] 1-2 business days to complete this process.  Drug prices often vary depending on where the prescription is filled and some pharmacies may offer cheaper prices.  The website www.goodrx.com contains coupons for medications through different pharmacies. The prices here do not account for what the cost may be with help from insurance (it may be cheaper with your insurance), but the website can give you the price if you did not use any insurance.  - You can print the associated coupon and take it with your prescription to the pharmacy.  - You may also stop by our office during regular business hours and pick up a GoodRx coupon card.  - If you need your prescription sent electronically to a different pharmacy, notify our office through [facility] or by phone at [phone] option 4.

## 2020-08-07 NOTE — Progress Notes (Signed)
Follow-Up Visit   Subjective  Emma Aguirre is a 85 y.o. female who presents for the following: 3 month follow up (Patient is here today for 3 month follow up on isk and aks. She reports places on face that was treated with ln2 are doing well. She reports ISK that was treated with Ln2 at last appointment on left popliteal fossa is still inflammed. ). She asks about some light spots at her face.   The following portions of the chart were reviewed this encounter and updated as appropriate:  Tobacco  Allergies  Meds  Problems  Med Hx  Surg Hx  Fam Hx      Objective  Well appearing patient in no apparent distress; mood and affect are within normal limits.  A focused examination was performed including face, left medial thigh, left popliteal fossa . Relevant physical exam findings are noted in the Assessment and Plan.  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 Anesthesia: the lesion was anesthetized in a standard fashion   Anesthetic:  1% lidocaine w/ epinephrine 1-100,000 buffered w/ 8.4% NaHCO3 Instrument used: flexible razor blade   Hemostasis achieved with: aluminum chloride   Outcome: patient tolerated procedure well   Post-procedure details: wound care instructions given   Additional details:  Mupirocin and a bandage applied  mupirocin ointment (BACTROBAN) 2 %  Specimen 1 - Surgical pathology Differential Diagnosis: r/o sccis  Check Margins: No 1.0 cm thin scaly pink plaque  Inflamed -  recommend biopsy today  R/o Sccis     Sebaceous Hyperplasia - Small yellow papules with a central dell - Benign-appearing - Observe  History of PreCancerous Actinic Keratosis  - sites of PreCancerous Actinic Keratosis clear today. - these may recur and new lesions may form requiring treatment to prevent transformation into skin cancer - observe for new or changing spots and contact [facility] for appointment if occur - photoprotection with sun protective clothing; sunglasses and broad spectrum sunscreen with SPF of at least 30 + and frequent self skin exams recommended - yearly exams by a dermatologist recommended for persons with history of PreCancerous Actinic Keratoses  Actinic Damage - chronic, secondary to cumulative UV radiation exposure/sun exposure over time - diffuse scaly erythematous macules with underlying dyspigmentation - Recommend daily broad spectrum sunscreen SPF 30+ to sun-exposed areas, reapply every 2 hours as needed.  - Recommend staying in the shade or wearing long sleeves, sun glasses (UVA+UVB protection) and wide brim hats (4-inch brim around the entire circumference of the hat). - Call for new or changing lesions. - Recommend taking Heliocare sun protection supplement daily in sunny weather for additional sun protection. For maximum protection on the sunniest days, you can take up to 2 capsules of regular Heliocare OR take 1 capsule of Heliocare Ultra. For prolonged exposure (such as a full day in the sun), you can repeat your dose of the supplement 4 hours after your first dose.   Return in about 4 months (around 12/08/2020) for  tinea ungiuim .  I, Ruthell Rummage, CMA, am acting as scribe for Forest Gleason, MD.  Documentation: I have reviewed the above documentation for accuracy and completeness, and I agree with the above.  Forest Gleason, MD

## 2020-08-11 DIAGNOSIS — C4492 Squamous cell carcinoma of skin, unspecified: Secondary | ICD-10-CM

## 2020-08-11 HISTORY — DX: Squamous cell carcinoma of skin, unspecified: C44.92

## 2020-08-12 ENCOUNTER — Encounter: Payer: Self-pay | Admitting: Dermatology

## 2020-08-13 ENCOUNTER — Telehealth: Payer: Self-pay

## 2020-08-13 NOTE — Telephone Encounter (Signed)
Patient scheduled for [facility] 10[facility]02/20 at 12 noon, JS

## 2020-08-13 NOTE — Telephone Encounter (Signed)
-----   Message from Alfonso Patten, MD sent at 08/13/2020  5:37 PM EDT ----- Skin , left popliteal fossa SQUAMOUS CELL CARCINOMA IN SITU, PERIPHERAL MARGIN INVOLVED --> ED&C  Dr. Laurence Ferrari called 08/13/2020 and discussed results and treatment recommendation  MAs please call to schedule for after her trip in June. Thank you!

## 2020-09-29 ENCOUNTER — Other Ambulatory Visit: Payer: Self-pay | Admitting: Oncology

## 2020-09-30 ENCOUNTER — Other Ambulatory Visit: Payer: Self-pay

## 2020-09-30 ENCOUNTER — Ambulatory Visit
Admission: RE | Admit: 2020-09-30 | Discharge: 2020-09-30 | Disposition: A | Discharge status: Home / Self Care | Payer: Medicare Other | Source: Ambulatory Visit | Attending: Oncology | Admitting: Oncology

## 2020-09-30 DIAGNOSIS — Z86000 Personal history of in-situ neoplasm of breast: Secondary | ICD-10-CM | POA: Insufficient documentation

## 2020-10-02 ENCOUNTER — Encounter: Payer: Self-pay | Admitting: Dermatology

## 2020-10-02 ENCOUNTER — Ambulatory Visit: Payer: Medicare Other | Admitting: Dermatology

## 2020-10-02 ENCOUNTER — Other Ambulatory Visit: Payer: Self-pay

## 2020-10-02 DIAGNOSIS — D099 Carcinoma in situ, unspecified: Secondary | ICD-10-CM

## 2020-10-02 DIAGNOSIS — D0472 Carcinoma in situ of skin of left lower limb, including hip: Secondary | ICD-10-CM | POA: Diagnosis not present

## 2020-10-02 NOTE — Progress Notes (Signed)
   Follow-Up Visit   Subjective  Emma Aguirre is a 85 y.o. female who presents for the following: Procedure (Patient here today for [facility] to bx proven SCCis at left popliteal fossa.).  The following portions of the chart were reviewed this encounter and updated as appropriate:   Tobacco  Allergies  Meds  Problems  Med Hx  Surg Hx  Fam Hx      Review of Systems:  No other skin or systemic complaints except as noted in HPI or Assessment and Plan.  Objective  Well appearing patient in no apparent distress; mood and affect are within normal limits.  A focused examination was performed including legs. Relevant physical exam findings are noted in the Assessment and Plan.  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 Anesthesia: the lesion was anesthetized in a standard fashion   Anesthetic:  1% lidocaine w/ epinephrine 1-100,000 buffered w/ 8.4% NaHCO3 Curettage performed in three different directions: Yes   Electrodesiccation performed over the curetted area: Yes   Curettage cycles:  3 Final wound size (cm):  1.4 Hemostasis achieved with:  electrodesiccation Outcome: patient tolerated procedure well with no complications   Post-procedure details: wound care instructions given   Additional details:  Mupirocin and a pressure dressing applied  Return for tinea follow up as scheduled , TBSE.  Graciella Belton, RMA, am acting as scribe for Forest Gleason, MD .   Documentation: I have reviewed the above documentation for accuracy and completeness, and I agree with the above.  Forest Gleason, MD

## 2020-10-02 NOTE — Patient Instructions (Signed)
Wound Care Instructions  Cleanse wound gently with soap and water once a day then pat dry with clean gauze. Apply a thing coat of Petrolatum (petroleum jelly, "Vaseline") over the wound (unless you have an allergy to this). We recommend that you use a new, sterile tube of Vaseline. Do not pick or remove scabs. Do not remove the yellow or white "healing tissue" from the base of the wound.  Cover the wound with fresh, clean, nonstick gauze and secure with paper tape. You may use Band-Aids in place of gauze and tape if the would is small enough, but would recommend trimming much of the tape off as there is often too much. Sometimes Band-Aids can irritate the skin.  You should call the office for your biopsy report after 1 week if you have not already been contacted.  If you experience any problems, such as abnormal amounts of bleeding, swelling, significant bruising, significant pain, or evidence of infection, please call the office immediately.  FOR ADULT SURGERY PATIENTS: If you need something for pain relief you may take 1 extra strength Tylenol (acetaminophen) AND 2 Ibuprofen (222m each) together every 4 hours as needed for pain. (do not take these if you are allergic to them or if you have a reason you should not take them.) Typically, you may only need pain medication for 1 to 3 days.    Recommend daily broad spectrum sunscreen SPF 30+ to sun-exposed areas, reapply every 2 hours as needed. Call for new or changing lesions.  Staying in the shade or wearing long sleeves, sun glasses (UVA+UVB protection) and wide brim hats (4-inch brim around the entire circumference of the hat) are also recommended for sun protection.   If you have any questions or concerns for your doctor, please call our main line at [phone] press option 4 to reach your doctor's medical assistant. If no one answers, please leave a voicemail as directed and we will return your call as soon as possible. Messages left after  4 pm will be answered the following business day.   You may also send [place] message via [place] We typically respond to MyChart messages within 1-2 business days.  For prescription refills, please ask your pharmacy to contact our office. Our fax number is [phone]  If you have an urgent issue when the clinic is closed that cannot wait until the next business day, you can page your doctor at the number below.    Please note that while we do our best to be available for urgent issues outside of office hours, we are not available 24/7.   If you have an urgent issue and are unable to reach [place] you may choose to seek medical care at your doctor's office, retail clinic, urgent care center, or emergency room.  If you have a medical emergency, please immediately call 911 or go to the emergency department.  Pager Numbers  - Dr. KNehemiah Massed [phone] - Dr. MLaurence Ferrari [phone] - Dr. SNicole Kindred [phone] In the event of inclement weather, please call our main line at [phone] an update on the status of any delays or closures.  Dermatology Medication Tips: Please keep the boxes that topical medications come in in order to help keep track of the instructions about where and how to use these. Pharmacies typically print the medication instructions only on the boxes and not directly on the medication tubes.   If your medication is too expensive, please contact our office at [phone]  4 or send [place] a message through [place].   We are unable to tell what your co-pay for medications will be in advance as this is different depending on your insurance coverage. However, we may be able to find a substitute medication at lower cost or fill out paperwork to get insurance to cover a needed medication.   If a prior authorization is required to get your medication covered by your insurance company, please allow [place] 1-2 business days to complete this process.  Drug prices often vary  depending on where the prescription is filled and some pharmacies may offer cheaper prices.  The website www.goodrx.com contains coupons for medications through different pharmacies. The prices here do not account for what the cost may be with help from insurance (it may be cheaper with your insurance), but the website can give you the price if you did not use any insurance.  - You can print the associated coupon and take it with your prescription to the pharmacy.  - You may also stop by our office during regular business hours and pick up a GoodRx coupon card.  - If you need your prescription sent electronically to a different pharmacy, notify our office through [facility] or by phone at [phone] option 4.

## 2020-10-19 ENCOUNTER — Encounter: Payer: Self-pay | Admitting: Dermatology

## 2020-12-10 ENCOUNTER — Ambulatory Visit: Payer: Medicare Other | Admitting: Dermatology

## 2020-12-12 ENCOUNTER — Inpatient Hospital Stay: Payer: Medicare Other

## 2020-12-12 ENCOUNTER — Encounter: Payer: Self-pay | Admitting: Oncology

## 2020-12-12 ENCOUNTER — Inpatient Hospital Stay: Payer: Medicare Other | Attending: Oncology | Admitting: Oncology

## 2020-12-12 ENCOUNTER — Ambulatory Visit: Payer: Medicare Other | Admitting: Oncology

## 2020-12-12 ENCOUNTER — Ambulatory Visit: Payer: Medicare Other

## 2020-12-12 ENCOUNTER — Other Ambulatory Visit: Payer: Medicare Other

## 2020-12-12 VITALS — BP 144/71 | HR 68 | Temp 98.2°F | Resp 18 | Wt 121.3 lb

## 2020-12-12 DIAGNOSIS — Z79811 Long term (current) use of aromatase inhibitors: Secondary | ICD-10-CM | POA: Insufficient documentation

## 2020-12-12 DIAGNOSIS — Z86000 Personal history of in-situ neoplasm of breast: Secondary | ICD-10-CM | POA: Diagnosis not present

## 2020-12-12 DIAGNOSIS — M816 Localized osteoporosis [Lequesne]: Secondary | ICD-10-CM | POA: Diagnosis not present

## 2020-12-12 DIAGNOSIS — M8580 Other specified disorders of bone density and structure, unspecified site: Secondary | ICD-10-CM

## 2020-12-12 LAB — CBC WITH DIFFERENTIAL/PLATELET
Abs Immature Granulocytes: 0.02 10*3/uL (ref 0.00–0.07)
Basophils Absolute: 0.1 10*3/uL (ref 0.0–0.1)
Basophils Relative: 1 %
Eosinophils Absolute: 0.2 10*3/uL (ref 0.0–0.5)
Eosinophils Relative: 4 %
HCT: 38.2 % (ref 36.0–46.0)
Hemoglobin: 12.6 g/dL (ref 12.0–15.0)
Immature Granulocytes: 0 %
Lymphocytes Relative: 19 %
Lymphs Abs: 1 10*3/uL (ref 0.7–4.0)
MCH: 31.3 pg (ref 26.0–34.0)
MCHC: 33 g/dL (ref 30.0–36.0)
MCV: 95 fL (ref 80.0–100.0)
Monocytes Absolute: 0.6 10*3/uL (ref 0.1–1.0)
Monocytes Relative: 10 %
Neutro Abs: 3.5 10*3/uL (ref 1.7–7.7)
Neutrophils Relative %: 66 %
Platelets: 272 10*3/uL (ref 150–400)
RBC: 4.02 MIL/uL (ref 3.87–5.11)
RDW: 12.9 % (ref 11.5–15.5)
WBC: 5.4 10*3/uL (ref 4.0–10.5)
nRBC: 0 % (ref 0.0–0.2)

## 2020-12-12 LAB — COMPREHENSIVE METABOLIC PANEL
ALT: 17 U/L (ref 0–44)
AST: 21 U/L (ref 15–41)
Albumin: 4.2 g/dL (ref 3.5–5.0)
Alkaline Phosphatase: 56 U/L (ref 38–126)
Anion gap: 6 (ref 5–15)
BUN: 25 mg/dL — ABNORMAL HIGH (ref 8–23)
CO2: 29 mmol/L (ref 22–32)
Calcium: 9.2 mg/dL (ref 8.9–10.3)
Chloride: 104 mmol/L (ref 98–111)
Creatinine, Ser: 0.7 mg/dL (ref 0.44–1.00)
GFR, Estimated: >60 mL/min (ref >60)
Glucose, Bld: 102 mg/dL — ABNORMAL HIGH (ref 70–99)
Potassium: 3.9 mmol/L (ref 3.5–5.1)
Sodium: 139 mmol/L (ref 135–145)
Total Bilirubin: 0.4 mg/dL (ref 0.3–1.2)
Total Protein: 6.4 g/dL — ABNORMAL LOW (ref 6.5–8.1)

## 2020-12-12 MED ORDER — ZOLEDRONIC ACID 4 MG/5ML IV CONC
3.0000 mg | Freq: Once | INTRAVENOUS | Status: AC
  Administered 2020-12-12: 3 mg via INTRAVENOUS
  Filled 2020-12-12: qty 3.75

## 2020-12-12 MED ORDER — SODIUM CHLORIDE 0.9 % IV SOLN
Freq: Once | INTRAVENOUS | Status: AC
  Filled 2020-12-12: qty 250

## 2020-12-12 NOTE — Progress Notes (Signed)
Patient here for follow up. Pt reports no new concerns. No new breast problems.

## 2020-12-12 NOTE — Patient Instructions (Signed)
[facility]  Discharge Instructions: Thank you for choosing [facility] to provide your oncology and hematology care.  If you have a lab appointment with the [facility], please go directly to the [facility] and check in at the registration area.  Wear comfortable clothing and clothing appropriate for easy access to any Portacath or PICC line.   We strive to give you quality time with your provider. You may need to reschedule your appointment if you arrive late (15 or more minutes).  Arriving late affects you and other patients whose appointments are after yours.  Also, if you miss three or more appointments without notifying the office, you may be dismissed from the clinic at the provider's discretion.      For prescription refill requests, have your pharmacy contact our office and allow 72 hours for refills to be completed.    Today you received the following chemotherapy and/or immunotherapy agents ZOMETA      To help prevent nausea and vomiting after your treatment, we encourage you to take your nausea medication as directed.  BELOW ARE SYMPTOMS THAT SHOULD BE REPORTED IMMEDIATELY: *FEVER GREATER THAN 100.4 F (38 C) OR HIGHER *CHILLS OR SWEATING *NAUSEA AND VOMITING THAT IS NOT CONTROLLED WITH YOUR NAUSEA MEDICATION *UNUSUAL SHORTNESS OF BREATH *UNUSUAL BRUISING OR BLEEDING *URINARY PROBLEMS (pain or burning when urinating, or frequent urination) *BOWEL PROBLEMS (unusual diarrhea, constipation, pain near the anus) TENDERNESS IN MOUTH AND THROAT WITH OR WITHOUT PRESENCE OF ULCERS (sore throat, sores in mouth, or a toothache) UNUSUAL RASH, SWELLING OR PAIN  UNUSUAL VAGINAL DISCHARGE OR ITCHING   Items with * indicate a potential emergency and should be followed up as soon as possible or go to the Emergency Department if any problems should occur.  Please show the CHEMOTHERAPY ALERT CARD or IMMUNOTHERAPY ALERT CARD at check-in to  the Emergency Department and triage nurse.  Should you have questions after your visit or need to cancel or reschedule your appointment, please contact [facility]  [phone] and follow the prompts.  Office hours are 8:00 a.m. to 4:30 p.m. Monday - Friday. Please note that voicemails left after 4:00 p.m. may not be returned until the following business day.  We are closed weekends and major holidays. You have access to a nurse at all times for urgent questions. Please call the main number to the clinic [phone] and follow the prompts.  For any non-urgent questions, you may also contact your provider using MyChart. We now offer e-Visits for anyone 86 and older to request care online for non-urgent symptoms. For details visit [email or web address].   Also download the MyChart app! Go to the app store, search "MyChart", open the app, select [facility], and log in with your MyChart username and password.  Due to Covid, a mask is required upon entering the hospital/clinic. If you do not have a mask, one will be given to you upon arrival. For doctor visits, patients may have 1 support person aged 73 or older with them. For treatment visits, patients cannot have anyone with them due to current Covid guidelines and our immunocompromised population.   Zoledronic Acid Injection (Hypercalcemia, Oncology) What is this medication? ZOLEDRONIC ACID (ZOE le dron ik AS id) slows calcium loss from bones. It high calcium levels in the blood from some kinds of cancer. It may be used in other people at risk for bone loss. This medicine may be used for other purposes; ask  your health care provider or pharmacist if you have questions. COMMON BRAND NAME(S): Zometa What should I tell my care team before I take this medication? They need to know if you have any of these conditions: cancer dehydration dental disease kidney disease liver disease low levels of calcium in the  blood lung or breathing disease (asthma) receiving steroids like dexamethasone or prednisone an unusual or allergic reaction to zoledronic acid, other medicines, foods, dyes, or preservatives pregnant or trying to get pregnant breast-feeding How should I use this medication? This drug is injected into a vein. It is given by a health care provider in a hospital or clinic setting. Talk to your health care provider about the use of this drug in children. Special care may be needed. Overdosage: If you think you have taken too much of this medicine contact a poison control center or emergency room at once. NOTE: This medicine is only for you. Do not share this medicine with others. What if I miss a dose? Keep appointments for follow-up doses. It is important not to miss your dose. Call your health care provider if you are unable to keep an appointment. What may interact with this medication? certain antibiotics given by injection NSAIDs, medicines for pain and inflammation, like ibuprofen or naproxen some diuretics like bumetanide, furosemide teriparatide thalidomide This list may not describe all possible interactions. Give your health care provider a list of all the medicines, herbs, non-prescription drugs, or dietary supplements you use. Also tell them if you smoke, drink alcohol, or use illegal drugs. Some items may interact with your medicine. What should I watch for while using this medication? Visit your health care provider for regular checks on your progress. It may be some time before you see the benefit from this drug. Some people who take this drug have severe bone, joint, or muscle pain. This drug may also increase your risk for jaw problems or a broken thigh bone. Tell your health care provider right away if you have severe pain in your jaw, bones, joints, or muscles. Tell you health care provider if you have any pain that does not go away or that gets worse. Tell your dentist and  dental surgeon that you are taking this drug. You should not have major dental surgery while on this drug. See your dentist to have a dental exam and fix any dental problems before starting this drug. Take good care of your teeth while on this drug. Make sure you see your dentist for regular follow-up appointments. You should make sure you get enough calcium and vitamin D while you are taking this drug. Discuss the foods you eat and the vitamins you take with your health care provider. Check with your health care provider if you have severe diarrhea, nausea, and vomiting, or if you sweat a lot. The loss of too much body fluid may make it dangerous for you to take this drug. You may need blood work done while you are taking this drug. Do not become pregnant while taking this drug. Women should inform their health care provider if they wish to become pregnant or think they might be pregnant. There is potential for serious harm to an unborn child. Talk to your health care provider for more information. What side effects may I notice from receiving this medication? Side effects that you should report to your doctor or health care provider as soon as possible: allergic reactions (skin rash, itching or hives; swelling of the face, lips, or  tongue) bone pain infection (fever, chills, cough, sore throat, pain or trouble passing urine) jaw pain, especially after dental work joint pain kidney injury (trouble passing urine or change in the amount of urine) low blood pressure (dizziness; feeling faint or lightheaded, falls; unusually weak or tired) low calcium levels (fast heartbeat; muscle cramps or pain; pain, tingling, or numbness in the hands or feet; seizures) low magnesium levels (fast, irregular heartbeat; muscle cramp or pain; muscle weakness; tremors; seizures) low red blood cell counts (trouble breathing; feeling faint; lightheaded, falls; unusually weak or tired) muscle pain redness, blistering,  peeling, or loosening of the skin, including inside the mouth severe diarrhea swelling of the ankles, feet, hands trouble breathing Side effects that usually do not require medical attention (report to your doctor or health care provider if they continue or are bothersome): anxious constipation coughing depressed mood eye irritation, itching, or pain fever general ill feeling or flu-like symptoms nausea pain, redness, or irritation at site where injected trouble sleeping This list may not describe all possible side effects. Call your doctor for medical advice about side effects. You may report side effects to FDA at 1-800-FDA-1088. Where should I keep my medication? This drug is given in a hospital or clinic. It will not be stored at home. NOTE: This sheet is a summary. It may not cover all possible information. If you have questions about this medicine, talk to your doctor, pharmacist, or health care provider.  2022 Elsevier/Gold Standard (2019-01-04 09:13:00)

## 2020-12-13 ENCOUNTER — Encounter: Payer: Self-pay | Admitting: Oncology

## 2020-12-13 NOTE — Progress Notes (Signed)
Hematology/Oncology Consult note [facility] Telephone:([phone] [phone]   Patient Care Team: Rusty Aus, MD as PCP - General (Internal Medicine)  REFERRING PROVIDER: Rusty Aus, MD REASON FOR VISIT:  Follow-up and management for DCIS.  HISTORY OF PRESENTING ILLNESS:  Emma Aguirre is a  85 y.o.  female with PMH listed below who was referred to me for evaluation of DCIS.  Patient is a retired Therapist, sports.  Patient had diagnostic mammogram right, on 01/13/2018 to follow up right upper quadrant calcifications.  Mammogram showed right upper quadrant 84m group of calcification, for which stereotactic core needle biopsy was performed.   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    Family history: denies any breast cancer or ovarian cancer OCP use: report remote use of OCP Estrogen and progesterone therapy: endorses history of hormone replacement therapy History of radiation to chest: denies.  Denies any breast pain, nipple discharge, breast skin changes.  #Treatment - She underwent lumpectomy of right breast on 05/02/2018. 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  Clip and biopsy site present.  Background fibrocystic changes.  No evidence of invasive carcinoma.  Size of DCIS at least 5 mm.  Margins uninvolved by DCIS.  Closest margin 6 mm. pTis(DCIS) pNx.  Hormone receptor studies were performed on previous biopsy specimen. ER> 90%, PR 51 to 90% -March 2020 finished adjuvant radiation. -April 2020 started on Arimidex 1 mg daily. -[facility] 6 months.  Discussed about other options about switching to tamoxifen and patient prefers to stay on aromatase inhibitor.   INTERVAL HISTORY Emma Aguirre a 85y.o. female who has above history reviewed by me today presents for follow up visit for management of DCIS,  status post lumpectomy 05/02/2018 Patient takes Arimidex. She reports feeling well.  No new complaints and no breast concerns.  Patient takes biotin and hair loss has improved.  Review of Systems  Constitutional:  Negative for chills, fever, malaise/fatigue and weight loss.  HENT:  Negative for nosebleeds and sore throat.   Eyes:  Negative for double vision, photophobia and redness.  Respiratory:  Negative for cough, shortness of breath and wheezing.   Cardiovascular:  Negative for chest pain, palpitations, orthopnea and leg swelling.  Gastrointestinal:  Negative for abdominal pain, blood in stool, nausea and vomiting.  Genitourinary:  Negative for dysuria.  Musculoskeletal:  Positive for joint pain. Negative for back pain and neck pain.  Skin:  Negative for itching and rash.  Neurological:  Negative for dizziness, tingling and tremors.  Endo/Heme/Allergies:  Negative for environmental allergies. Does not bruise/bleed easily.  Psychiatric/Behavioral:  Negative for depression and hallucinations.    MEDICAL HISTORY:  Past Medical History:  Diagnosis Date   Anemia    RELATED TO CELIAC DX   Arthritis    Basal cell carcinoma    on the face and legs - about 15 years ago    Breast cancer ([facility] 2019   Celiac disease    DCIS (ductal carcinoma in situ) 01/23/2018   right breast DUCTAL CARCINOMA IN SITU, INTERMEDIATE GRADE, WITHOUT COMEDONECROSIS   Hyperlipidemia    Personal history of radiation therapy    SCC (squamous cell carcinoma) 08/11/2020   left popliteal fossa, EDC 10/02/20    SURGICAL HISTORY: Past Surgical History:  Procedure Laterality Date   BREAST BIOPSY Right 01/23/2018   stereo calcs  x clip, DUCTAL CARCINOMA IN SITU, INTERMEDIATE GRADE,  WITHOUT COMEDONECROSIS   BREAST EXCISIONAL BIOPSY     BREAST LUMPECTOMY Right 05/02/2018   BREAST LUMPECTOMY WITH NEEDLE LOCALIZATION AND AXILLARY SENTINEL LYMPH NODE BX Right 05/02/2018   Procedure: BREAST LUMPECTOMY WITH NEEDLE  LOCALIZATION AND POSSIBLE SENTINEL NODE BX;  Surgeon: Benjamine Sprague, DO;  Location: [facility] ORS;  Service: General;  Laterality: Right;   TUBAL LIGATION      SOCIAL HISTORY: Social History   Socioeconomic History   Marital status: Married    Spouse name: Not on file   Number of children: Not on file   Years of education: Not on file   Highest education level: Not on file  Occupational History   Not on file  Tobacco Use   Smoking status: Never   Smokeless tobacco: Never  Vaping Use   Vaping Use: Never used  Substance and Sexual Activity   Alcohol use: Yes    Comment: 1 GLASS WINE A WEEK    Drug use: Never   Sexual activity: Not on file  Other Topics Concern   Not on file  Social History Narrative   Not on file   Social Determinants of Health   Financial Resource Strain: Not on file  Food Insecurity: Not on file  Transportation Needs: Not on file  Physical Activity: Not on file  Stress: Not on file  Social Connections: Not on file  Intimate Partner Violence: Not on file    FAMILY HISTORY: Family History  Problem Relation Age of Onset   Non-Hodgkin's lymphoma Mother    Heart attack Father    Hypertension Brother    Breast cancer Neg Hx    Colon cancer Neg Hx     ALLERGIES:  is allergic to wasp venom protein.  MEDICATIONS:  Current Outpatient Medications  Medication Sig Dispense Refill   anastrozole (ARIMIDEX) 1 MG tablet TAKE 1 TABLET BY MOUTH  DAILY 90 tablet 1   Biotin 5 MG TBDP Take 5,000 mcg by mouth daily.     calcium citrate-vitamin D (CITRACAL+D) 315-200 MG-UNIT tablet Take by mouth.     Cholecalciferol 25 MCG (1000 UT) tablet Take 1,000 Units by mouth daily.     cyanocobalamin 1000 MCG tablet Take 2,500 mcg by mouth.      Magnesium 250 MG TABS Take 250 mg by mouth at bedtime as needed.      mupirocin ointment (BACTROBAN) 2 % Apply 1 application topically daily. Apply daily to area behind left knee and cover with band aid 22 g 0   naproxen sodium (ALEVE)  220 MG tablet Take by mouth.     simvastatin (ZOCOR) 40 MG tablet Take 40 mg by mouth every evening.      terbinafine (LAMISIL) 250 MG tablet Take 1 tablet (250 mg total) by mouth daily. Take 1 daily as instructed for pulse dosing. 28 tablet 0   vitamin C (ASCORBIC ACID) 250 MG tablet Take 250 mg by mouth daily.     No current facility-administered medications for this visit.     PHYSICAL EXAMINATION: ECOG PERFORMANCE STATUS: 1 - Symptomatic but completely ambulatory Vitals:   12/12/20 1043  BP: (!) 144/71  Pulse: 68  Resp: 18  Temp: 98.2 F (36.8 C)  SpO2: 99%   Filed Weights   12/12/20 1043  Weight: 121 lb 4.8 oz (55 kg)    Physical Exam Constitutional:      General: She is not in acute distress. HENT:     Head: Normocephalic and atraumatic.  Eyes:  General: No scleral icterus.    Pupils: Pupils are equal, round, and reactive to light.  Cardiovascular:     Rate and Rhythm: Normal rate and regular rhythm.     Heart sounds: Normal heart sounds.  Pulmonary:     Effort: Pulmonary effort is normal. No respiratory distress.     Breath sounds: No wheezing.  Abdominal:     General: Bowel sounds are normal.     Palpations: Abdomen is soft.  Musculoskeletal:        General: No deformity. Normal range of motion.     Cervical back: Normal range of motion and neck supple.  Skin:    General: Skin is warm and dry.     Findings: No erythema or rash.  Neurological:     Mental Status: She is alert and oriented to person, place, and time. Mental status is at baseline.     Cranial Nerves: No cranial nerve deficit.     Coordination: Coordination normal.  Psychiatric:        Mood and Affect: Mood normal.   Breast exam was performed in seated and lying down position. Patient is status post right breast lumpectomy with a well-healed surgical scar with tissue thickening around the surgical scar..  No palpable axillary lymphadenopathy bilaterally.  No palpable breast mass on the  left side.  both nipples are chronically retracted    LABORATORY DATA:  I have reviewed the data as listed Lab Results  Component Value Date   WBC 5.4 12/12/2020   HGB 12.6 12/12/2020   HCT 38.2 12/12/2020   MCV 95.0 12/12/2020   PLT 272 12/12/2020   Recent Labs    06/06/20 1315 12/12/20 1025  NA 139 139  K 4.0 3.9  CL 104 104  CO2 27 29  GLUCOSE 88 102*  BUN 22 25*  CREATININE 0.67 0.70  CALCIUM 9.2 9.2  GFRNONAA >60 >60  PROT 6.4* 6.4*  ALBUMIN 4.1 4.2  AST 23 21  ALT 18 17  ALKPHOS 54 56  BILITOT 0.6 0.4    Iron/TIBC/Ferritin/ %Sat No results found for: IRON, TIBC, FERRITIN, IRONPCTSAT     ASSESSMENT & PLAN:  1. History of ductal carcinoma in situ (DCIS) of breast   2. Aromatase inhibitor use   3. Localized osteoporosis without current pathological fracture    #History of DCIS Labs are reviewed and discussed with patient. Continue Arimidex 49m daily.  June 2022 bilateral diagnostic mammogram showed no evidence of new or recurrent breast cancer.    Osteoporosis, in the context of aromatase inhibitor use. Continue calcium and vitamin D supplementation. Proceed with Zometa 453mevery 6 months.  Zometa today.  Follow-up in 6 months.  ZhEarlie ServerMD, PhD Hematology Oncology [facility]9/01/2021

## 2020-12-16 ENCOUNTER — Ambulatory Visit: Payer: Medicare Other | Admitting: Dermatology

## 2020-12-31 ENCOUNTER — Encounter: Payer: Self-pay | Admitting: Radiation Oncology

## 2020-12-31 ENCOUNTER — Ambulatory Visit
Admission: RE | Admit: 2020-12-31 | Discharge: 2020-12-31 | Disposition: A | Discharge status: Home / Self Care | Payer: Medicare Other | Source: Ambulatory Visit | Attending: Radiation Oncology | Admitting: Radiation Oncology

## 2020-12-31 ENCOUNTER — Other Ambulatory Visit: Payer: Self-pay

## 2020-12-31 VITALS — BP 123/67 | Temp 97.6°F | Resp 16 | Wt 124.2 lb

## 2020-12-31 DIAGNOSIS — Z923 Personal history of irradiation: Secondary | ICD-10-CM | POA: Diagnosis not present

## 2020-12-31 DIAGNOSIS — Z79811 Long term (current) use of aromatase inhibitors: Secondary | ICD-10-CM | POA: Insufficient documentation

## 2020-12-31 DIAGNOSIS — D0511 Intraductal carcinoma in situ of right breast: Secondary | ICD-10-CM | POA: Diagnosis present

## 2020-12-31 DIAGNOSIS — Z170 Estrogen receptor positive status [ER+]: Secondary | ICD-10-CM | POA: Diagnosis not present

## 2020-12-31 NOTE — Progress Notes (Signed)
Radiation Oncology Follow up Note  Name: Emma Aguirre   Date:   12/31/2020 MRN:  [id number] DOB: 03-Aug-1935    This 85 y.o. female presents to the clinic today for 2 and half year follow-up status post whole breast radiation to her right breast for ER/PR positive ductal carcinoma in situ.  REFERRING PROVIDER: Rusty Aus, MD  HPI: Patient is a 85 year old female now out 2-1/2 years having completed whole breast radiation to her right breast for ER/PR positive ductal carcinoma in situ.  Seen today and she is doing well.  She specifically denies breast tenderness cough or bone pain..  Mammograms back in June which I have reviewed were BI-RADS 2 benign.  She is currently on Arimidex tolerant at well without side effect. COMPLICATIONS OF TREATMENT: none  FOLLOW UP COMPLIANCE: keeps appointments   PHYSICAL EXAM:  BP 123/67   Temp 97.6 F (36.4 C) (Tympanic)   Resp 16   Wt 124 lb 3.2 oz (56.3 kg)   BMI 22.72 kg/m  Lungs are clear to A&P cardiac examination essentially unremarkable with regular rate and rhythm. No dominant mass or nodularity is noted in either breast in 2 positions examined. Incision is well-healed. No axillary or supraclavicular adenopathy is appreciated. Cosmetic result is excellent.  Well-developed well-nourished patient in NAD. HEENT reveals PERLA, EOMI, discs not visualized.  Oral cavity is clear. No oral mucosal lesions are identified. Neck is clear without evidence of cervical or supraclavicular adenopathy. Lungs are clear to A&P. Cardiac examination is essentially unremarkable with regular rate and rhythm without murmur rub or thrill. Abdomen is benign with no organomegaly or masses noted. Motor sensory and DTR levels are equal and symmetric in the upper and lower extremities. Cranial nerves II through XII are grossly intact. Proprioception is intact. No peripheral adenopathy or edema is identified. No motor or sensory levels are noted. Crude visual fields are within  normal range.  RADIOLOGY RESULTS: Mammograms reviewed compatible with above-stated findings  PLAN: Present time patient continues to do well now 2-1/2 years with no evidence of disease I am pleased with her overall progress.  I have asked to see her back in 1 year for follow-up.  She continues on Arimidex without side effect.  Patient knows to call with any concerns.  I would like to take this opportunity to thank you for allowing me to participate in the care of your patient.Noreene Filbert, MD

## 2021-02-15 ENCOUNTER — Other Ambulatory Visit: Payer: Self-pay | Admitting: Oncology

## 2021-03-11 ENCOUNTER — Other Ambulatory Visit: Payer: Self-pay

## 2021-03-11 ENCOUNTER — Ambulatory Visit: Payer: Medicare Other | Admitting: Dermatology

## 2021-03-11 DIAGNOSIS — B351 Tinea unguium: Secondary | ICD-10-CM

## 2021-03-11 DIAGNOSIS — L578 Other skin changes due to chronic exposure to nonionizing radiation: Secondary | ICD-10-CM | POA: Diagnosis not present

## 2021-03-11 DIAGNOSIS — L570 Actinic keratosis: Secondary | ICD-10-CM | POA: Diagnosis not present

## 2021-03-11 DIAGNOSIS — D692 Other nonthrombocytopenic purpura: Secondary | ICD-10-CM

## 2021-03-11 DIAGNOSIS — L814 Other melanin hyperpigmentation: Secondary | ICD-10-CM

## 2021-03-11 DIAGNOSIS — Z1283 Encounter for screening for malignant neoplasm of skin: Secondary | ICD-10-CM | POA: Diagnosis not present

## 2021-03-11 DIAGNOSIS — Z85828 Personal history of other malignant neoplasm of skin: Secondary | ICD-10-CM

## 2021-03-11 DIAGNOSIS — L738 Other specified follicular disorders: Secondary | ICD-10-CM

## 2021-03-11 DIAGNOSIS — D229 Melanocytic nevi, unspecified: Secondary | ICD-10-CM

## 2021-03-11 DIAGNOSIS — D1801 Hemangioma of skin and subcutaneous tissue: Secondary | ICD-10-CM

## 2021-03-11 DIAGNOSIS — L821 Other seborrheic keratosis: Secondary | ICD-10-CM

## 2021-03-11 NOTE — Patient Instructions (Addendum)
Cryotherapy Aftercare  Wash gently with soap and water everyday.   Apply Vaseline and Band-Aid daily until healed.   Prior to procedure, discussed risks of blister formation, small wound, skin dyspigmentation, or rare scar following cryotherapy. Recommend Vaseline ointment to treated areas while healing.  Melanoma ABCDEs  Melanoma is the most dangerous type of skin cancer, and is the leading cause of death from skin disease.  You are more likely to develop melanoma if you: Have light-colored skin, light-colored eyes, or red or blond hair Spend a lot of time in the sun Tan regularly, either outdoors or in a tanning bed Have had blistering sunburns, especially during childhood Have a close family member who has had a melanoma Have atypical moles or large birthmarks  Early detection of melanoma is key since treatment is typically straightforward and cure rates are extremely high if we catch it early.   The first sign of melanoma is often a change in a mole or a new dark spot.  The ABCDE system is a way of remembering the signs of melanoma.  A for asymmetry:  The two halves do not match. B for border:  The edges of the growth are irregular. C for color:  A mixture of colors are present instead of an even brown color. D for diameter:  Melanomas are usually (but not always) greater than 74m - the size of a pencil eraser. E for evolution:  The spot keeps changing in size, shape, and color.  Please check your skin once per month between visits. You can use a small mirror in front and a large mirror behind you to keep an eye on the back side or your body.   If you see any new or changing lesions before your next follow-up, please call to schedule a visit.  Please continue daily skin protection including broad spectrum sunscreen SPF 30+ to sun-exposed areas, reapplying every 2 hours as needed when you're outdoors.    If You Need Anything After Your Visit  If you have any questions or  concerns for your doctor, please call our main line at [phone] press option 4 to reach your doctor's medical assistant. If no one answers, please leave a voicemail as directed and we will return your call as soon as possible. Messages left after 4 pm will be answered the following business day.   You may also send [place] message via [facility]red[facility] We typically respond to MyChart messages within 1-2 business days.  For prescription refills, please ask your pharmacy to contact our office. Our fax number is [phone]  If you have an urgent issue when the clinic is closed that cannot wait until the next business day, you can page your doctor at the number below.    Please note that while we do our best to be available for urgent issues outside of office hours, we are not available 24/7.   If you have an urgent issue and are unable to reach u[place] you may choose to seek medical care at your doctor's office, retail clinic, urgent care center, or emergency room.  If you have a medical emergency, please immediately call 911 or go to the emergency department.  Pager Numbers  - Dr. KNehemiah Massed [phone] - Dr. MLaurence Ferrari [phone] - Dr. SNicole Kindred [phone] In the event of inclement weather, please call our main line at [phone] an update on the status of any delays or closures.  Dermatology Medication Tips: Please keep the boxes that  topical medications come in in order to help keep track of the instructions about where and how to use these. Pharmacies typically print the medication instructions only on the boxes and not directly on the medication tubes.   If your medication is too expensive, please contact our office at [phone] option 4 or send [place] a message through [place].   We are unable to tell what your co-pay for medications will be in advance as this is different depending on your insurance coverage. However, we may be able to find a substitute medication at lower cost or  fill out paperwork to get insurance to cover a needed medication.   If a prior authorization is required to get your medication covered by your insurance company, please allow [place] 1-2 business days to complete this process.  Drug prices often vary depending on where the prescription is filled and some pharmacies may offer cheaper prices.  The website www.goodrx.com contains coupons for medications through different pharmacies. The prices here do not account for what the cost may be with help from insurance (it may be cheaper with your insurance), but the website can give you the price if you did not use any insurance.  - You can print the associated coupon and take it with your prescription to the pharmacy.  - You may also stop by our office during regular business hours and pick up a GoodRx coupon card.  - If you need your prescription sent electronically to a different pharmacy, notify our office through [facility] or by phone at [phone] option 4.     Si Usted Necesita Algo Despus de Su Visita  Tambin puede enviarnos un mensaje a travs de Pharmacist, community. Por lo general respondemos a los mensajes de MyChart en el transcurso de 1 a 2 das hbiles.  Para renovar recetas, por favor pida a su farmacia que se ponga en contacto con nuestra oficina. Harland Dingwall de fax es [place] [phone].  Si tiene un asunto urgente cuando la clnica est cerrada y que no puede esperar hasta el siguiente da hbil, puede llamar/localizar a su doctor(a) al nmero que aparece a continuacin.   Por favor, tenga en cuenta que aunque hacemos todo lo posible para estar disponibles para asuntos urgentes fuera del horario de [place], no estamos disponibles las 24 horas del da, los 7 das de la [place].   Si tiene un problema urgente y no puede comunicarse con nosotros, puede optar por buscar atencin mdica  en el consultorio de su doctor(a), en una clnica privada, en un centro de atencin urgente o en una sala  de emergencias.  Si tiene Engineering geologist, por favor llame inmediatamente al 911 o vaya a la sala de emergencias.  Nmeros de bper  - Dr. Nehemiah Massed: [phone]  - Dra. Moye: [phone]  - Dra. Nicole Kindred: [phone]  En caso de inclemencias del [place], por favor llame a Johnsie Kindred principal al [phone] para una actualizacin sobre el [place] de cualquier retraso o cierre.  Consejos para la medicacin en dermatologa: Por favor, guarde las cajas en las que vienen los medicamentos de uso tpico para ayudarle a seguir las instrucciones sobre dnde y cmo usarlos. Las farmacias generalmente imprimen las instrucciones del medicamento slo en las cajas y no directamente en los tubos del [place].   Si su medicamento es muy caro, por favor, pngase en contacto con Zigmund Daniel llamando al [phone] y presione la opcin 4 o envenos un mensaje a travs de Pharmacist, community.   No  podemos decirle cul ser su copago por los medicamentos por adelantado ya que esto es diferente dependiendo de la cobertura de su seguro. Sin embargo, es posible que podamos encontrar un medicamento sustituto a Electrical engineer un formulario para que el seguro cubra el medicamento que se considera necesario.   Si se requiere una autorizacin previa para que su compaa de seguros [place] su medicamento, por favor permtanos de 1 a 2 das hbiles para completar este proceso.  Los precios de los medicamentos varan con frecuencia dependiendo del Environmental consultant de dnde se surte la receta y alguna farmacias pueden ofrecer precios ms baratos.  El sitio web www.goodrx.com tiene cupones para medicamentos de Airline pilot. Los precios aqu no tienen en cuenta lo que podra costar con la ayuda del seguro (puede ser ms barato con su seguro), pero el sitio web puede darle el precio si no utiliz Research scientist (physical sciences).  - Puede imprimir el cupn correspondiente y llevarlo con su receta a la farmacia.  - Tambin puede pasar  por nuestra oficina durante el horario de atencin regular y Charity fundraiser una tarjeta de cupones de GoodRx.  - Si necesita que su receta se enve electrnicamente a una farmacia diferente, informe a nuestra oficina a travs de MyChart de [place] o por telfono llamando al [phone] y presione la opcin 4.

## 2021-03-11 NOTE — Progress Notes (Signed)
Follow-Up Visit   Subjective  Emma Aguirre is a 85 y.o. female who presents for the following: FBSE (Patient here for full body skin exam and skin cancer screening. Patient with hx of SCC and BCC. Patient does have a spot at left wrist that has been there for some time but would like it checked, also a few spots at face she would like checked. ) and Follow-up (Patient did terbinafine pulse dosing for tinea at toenails and advises there has been some improvement.).  The following portions of the chart were reviewed this encounter and updated as appropriate:   Tobacco  Allergies  Meds  Problems  Med Hx  Surg Hx  Fam Hx      Review of Systems:  No other skin or systemic complaints except as noted in HPI or Assessment and Plan.  Objective  Well appearing patient in no apparent distress; mood and affect are within normal limits.  A full examination was performed including scalp, head, eyes, ears, nose, lips, neck, chest, axillae, abdomen, back, buttocks, bilateral upper extremities, bilateral lower extremities, hands, feet, fingers, toes, fingernails, and toenails. All findings within normal limits unless otherwise noted below.  left wrist x 1, right cheek x 1, left lateral cheek x 1, left temple x 1 (4) Erythematous thin papules/macules with gritty scale.   forehead Small yellow papules with a central dell.   toenails Yellow thickened toenails with subungual debris    Assessment & Plan  AK (actinic keratosis) (4) left wrist x 1, right cheek x 1, left lateral cheek x 1, left temple x 1  Prior to procedure, discussed risks of blister formation, small wound, skin dyspigmentation, or rare scar following cryotherapy. Recommend Vaseline ointment to treated areas while healing.  Actinic keratoses are precancerous spots that appear secondary to cumulative UV radiation exposure/sun exposure over time. They are chronic with expected duration over 1 year. A portion of actinic keratoses  will progress to squamous cell carcinoma of the skin. It is not possible to reliably predict which spots will progress to skin cancer and so treatment is recommended to prevent development of skin cancer.  Recommend daily broad spectrum sunscreen SPF 30+ to sun-exposed areas, reapply every 2 hours as needed.  Recommend staying in the shade or wearing long sleeves, sun glasses (UVA+UVB protection) and wide brim hats (4-inch brim around the entire circumference of the hat). Call for new or changing lesions.   Destruction of lesion - left wrist x 1, right cheek x 1, left lateral cheek x 1, left temple x 1  Destruction method: cryotherapy   Informed consent: discussed and consent obtained   Lesion destroyed using liquid nitrogen: Yes   Cryotherapy cycles:  2 Outcome: patient tolerated procedure well with no complications   Post-procedure details: wound care instructions given    Sebaceous hyperplasia forehead  Benign-appearing.  Observation.  Call clinic for new or changing lesions.    Tinea unguium toenails  Patient defers daily terbinafine treatment at this time. She will let us know if she decides to proceed with it and is aware she would need to do blood work.  Lentigines - Scattered tan macules - Due to sun exposure - Benign-appearing, observe - Recommend daily broad spectrum sunscreen SPF 30+ to sun-exposed areas, reapply every 2 hours as needed. - Call for any changes  Seborrheic Keratoses - Stuck-on, waxy, tan-brown papules and/or plaques  - Benign-appearing - Discussed benign etiology and prognosis. - Observe - Call for any changes  Melanocytic  [place]evi - Tan-brown and/or pink-flesh-colored symmetric macules and papules - Benign appearing on exam today - Observation - Call clinic for new or changing moles - Recommend daily use of broad spectrum spf 30+ sunscreen to sun-exposed areas.   Hemangiomas - Red papules - Discussed benign nature - Observe - Call for any  changes  Actinic Damage - Chronic condition, secondary to cumulative UV/sun exposure - diffuse scaly erythematous macules with underlying dyspigmentation - Recommend daily broad spectrum sunscreen SPF 30+ to sun-exposed areas, reapply every 2 hours as needed.  - Staying in the shade or wearing long sleeves, sun glasses (UVA+UVB protection) and wide brim hats (4-inch brim around the entire circumference of the hat) are also recommended for sun protection.  - Call for new or changing lesions.  Purpura - Chronic; persistent and recurrent.  Treatable, but not curable. - Violaceous macules and patches - Benign - Related to trauma, age, sun damage and/or use of blood thinners, chronic use of topical and/or oral steroids - Observe - Can use OTC arnica containing moisturizer such as Dermend Bruise Formula if desired - Call for worsening or other concerns  History of Basal Cell Carcinoma of the Skin - No evidence of recurrence today - Recommend regular full body skin exams - Recommend daily broad spectrum sunscreen SPF 30+ to sun-exposed areas, reapply every 2 hours as needed.  - Call if any new or changing lesions are noted between office visits  History of Squamous Cell Carcinoma of the Skin - No evidence of recurrence today - No lymphadenopathy - Recommend regular full body skin exams - Recommend daily broad spectrum sunscreen SPF 30+ to sun-exposed areas, reapply every 2 hours as needed.  - Call if any new or changing lesions are noted between office visits  Skin cancer screening performed today.  Return in about 6 months (around 09/09/2021) for TBSE.  Graciella Belton, RMA, am acting as scribe for Forest Gleason, MD .  Documentation: I have reviewed the above documentation for accuracy and completeness, and I agree with the above.  Forest Gleason, MD

## 2021-03-18 ENCOUNTER — Encounter: Payer: Self-pay | Admitting: Dermatology

## 2021-06-12 ENCOUNTER — Encounter: Payer: Self-pay | Admitting: Oncology

## 2021-06-12 ENCOUNTER — Inpatient Hospital Stay: Payer: Medicare Other | Attending: Oncology

## 2021-06-12 ENCOUNTER — Inpatient Hospital Stay (HOSPITAL_BASED_OUTPATIENT_CLINIC_OR_DEPARTMENT_OTHER): Payer: Medicare Other | Admitting: Oncology

## 2021-06-12 ENCOUNTER — Other Ambulatory Visit: Payer: Self-pay

## 2021-06-12 ENCOUNTER — Inpatient Hospital Stay: Payer: Medicare Other

## 2021-06-12 VITALS — BP 119/75 | HR 66 | Temp 96.8°F | Wt 120.0 lb

## 2021-06-12 DIAGNOSIS — M816 Localized osteoporosis [Lequesne]: Secondary | ICD-10-CM | POA: Insufficient documentation

## 2021-06-12 DIAGNOSIS — Z79811 Long term (current) use of aromatase inhibitors: Secondary | ICD-10-CM | POA: Diagnosis not present

## 2021-06-12 DIAGNOSIS — Z86000 Personal history of in-situ neoplasm of breast: Secondary | ICD-10-CM

## 2021-06-12 DIAGNOSIS — Z853 Personal history of malignant neoplasm of breast: Secondary | ICD-10-CM | POA: Diagnosis not present

## 2021-06-12 DIAGNOSIS — M8580 Other specified disorders of bone density and structure, unspecified site: Secondary | ICD-10-CM

## 2021-06-12 LAB — COMPREHENSIVE METABOLIC PANEL
ALT: 19 U/L (ref 0–44)
AST: 24 U/L (ref 15–41)
Albumin: 3.9 g/dL (ref 3.5–5.0)
Alkaline Phosphatase: 48 U/L (ref 38–126)
Anion gap: 6 (ref 5–15)
BUN: 26 mg/dL — ABNORMAL HIGH (ref 8–23)
CO2: 27 mmol/L (ref 22–32)
Calcium: 9 mg/dL (ref 8.9–10.3)
Chloride: 104 mmol/L (ref 98–111)
Creatinine, Ser: 0.78 mg/dL (ref 0.44–1.00)
GFR, Estimated: >60 mL/min (ref >60)
Glucose, Bld: 97 mg/dL (ref 70–99)
Potassium: 3.7 mmol/L (ref 3.5–5.1)
Sodium: 137 mmol/L (ref 135–145)
Total Bilirubin: 0.6 mg/dL (ref 0.3–1.2)
Total Protein: 6.5 g/dL (ref 6.5–8.1)

## 2021-06-12 LAB — CBC WITH DIFFERENTIAL/PLATELET
Abs Immature Granulocytes: 0.02 10*3/uL (ref 0.00–0.07)
Basophils Absolute: 0.1 10*3/uL (ref 0.0–0.1)
Basophils Relative: 1 %
Eosinophils Absolute: 0.3 10*3/uL (ref 0.0–0.5)
Eosinophils Relative: 5 %
HCT: 39.1 % (ref 36.0–46.0)
Hemoglobin: 12.8 g/dL (ref 12.0–15.0)
Immature Granulocytes: 0 %
Lymphocytes Relative: 18 %
Lymphs Abs: 1 10*3/uL (ref 0.7–4.0)
MCH: 31.4 pg (ref 26.0–34.0)
MCHC: 32.7 g/dL (ref 30.0–36.0)
MCV: 95.8 fL (ref 80.0–100.0)
Monocytes Absolute: 0.7 10*3/uL (ref 0.1–1.0)
Monocytes Relative: 12 %
Neutro Abs: 3.7 10*3/uL (ref 1.7–7.7)
Neutrophils Relative %: 64 %
Platelets: 284 10*3/uL (ref 150–400)
RBC: 4.08 MIL/uL (ref 3.87–5.11)
RDW: 12.2 % (ref 11.5–15.5)
WBC: 5.7 10*3/uL (ref 4.0–10.5)
nRBC: 0 % (ref 0.0–0.2)

## 2021-06-12 MED ORDER — ZOLEDRONIC ACID 4 MG/5ML IV CONC
3.3000 mg | Freq: Once | INTRAVENOUS | Status: AC
  Administered 2021-06-12: 3.3 mg via INTRAVENOUS
  Filled 2021-06-12: qty 4.13

## 2021-06-12 MED ORDER — SODIUM CHLORIDE 0.9 % IV SOLN
INTRAVENOUS | Status: DC
  Filled 2021-06-12: qty 250

## 2021-06-12 NOTE — Progress Notes (Signed)
Hematology/Oncology Progress note [phone] [phone]      Patient Care Team: Rusty Aus, MD as PCP - General (Internal Medicine)  REFERRING PROVIDER: Rusty Aus, MD REASON FOR VISIT:  Follow-up and management for DCIS.  HISTORY OF PRESENTING ILLNESS:  Emma Aguirre is a  86 y.o.  female with PMH listed below who was referred to me for evaluation of DCIS.  Patient is a retired Therapist, sports.  Patient had diagnostic mammogram right, on 01/13/2018 to follow up right upper quadrant calcifications.  Mammogram showed right upper quadrant 59m group of calcification, for which stereotactic core needle biopsy was performed.   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    Family history: denies any breast cancer or ovarian cancer OCP use: report remote use of OCP Estrogen and progesterone therapy: endorses history of hormone replacement therapy History of radiation to chest: denies.  Denies any breast pain, nipple discharge, breast skin changes.  #Treatment - She underwent lumpectomy of right breast on 05/02/2018. 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  Clip and biopsy site present.  Background fibrocystic changes.  No evidence of invasive carcinoma.  Size of DCIS at least 5 mm.  Margins uninvolved by DCIS.  Closest margin 6 mm. pTis(DCIS) pNx.  Hormone receptor studies were performed on previous biopsy specimen. ER> 90%, PR 51 to 90% -March 2020 finished adjuvant radiation. -April 2020 started on Arimidex 1 mg daily. -[facility] every 6 months.  Discussed about other options about switching to tamoxifen and patient prefers to stay on aromatase inhibitor.   Patient takes biotin and hair loss has improved.  INTERVAL HISTORY JEmerita Berkemeieris a 86y.o. female who has above history reviewed by me today presents for follow up visit  for management of DCIS, status post lumpectomy 05/02/2018 Patient takes Arimidex. She reports feeling well.  Patient has noted a left shoulder pain with raising arm in the morning.  Symptom improves spontaneously later in the day. No new complaints and no breast concerns.    Review of Systems  Constitutional:  Negative for chills, fever, malaise/fatigue and weight loss.  HENT:  Negative for nosebleeds and sore throat.   Eyes:  Negative for double vision, photophobia and redness.  Respiratory:  Negative for cough, shortness of breath and wheezing.   Cardiovascular:  Negative for chest pain, palpitations, orthopnea and leg swelling.  Gastrointestinal:  Negative for abdominal pain, blood in stool, nausea and vomiting.  Genitourinary:  Negative for dysuria.  Musculoskeletal:  Positive for joint pain. Negative for back pain and neck pain.  Skin:  Negative for itching and rash.  Neurological:  Negative for dizziness, tingling and tremors.  Endo/Heme/Allergies:  Negative for environmental allergies. Does not bruise/bleed easily.  Psychiatric/Behavioral:  Negative for depression and hallucinations.    MEDICAL HISTORY:  Past Medical History:  Diagnosis Date   Anemia    RELATED TO CELIAC DX   Arthritis    Basal cell carcinoma    on the face and legs - about 15 years ago    Breast cancer ([facility]   Celiac disease    DCIS (ductal carcinoma in situ) 01/23/2018   right breast DUCTAL CARCINOMA IN SITU, INTERMEDIATE GRADE, WITHOUT COMEDONECROSIS   Hyperlipidemia    Personal history of radiation therapy    SCC (squamous cell carcinoma) 08/11/2020   left popliteal fossa, EDC 10/02/20    SURGICAL HISTORY: Past Surgical History:  Procedure Laterality Date   BREAST BIOPSY Right 01/23/2018   stereo calcs  x clip, DUCTAL CARCINOMA IN SITU, INTERMEDIATE GRADE, WITHOUT COMEDONECROSIS   BREAST EXCISIONAL BIOPSY     BREAST LUMPECTOMY Right 05/02/2018   BREAST LUMPECTOMY WITH NEEDLE LOCALIZATION AND  AXILLARY SENTINEL LYMPH NODE BX Right 05/02/2018   Procedure: BREAST LUMPECTOMY WITH NEEDLE LOCALIZATION AND POSSIBLE SENTINEL NODE BX;  Surgeon: Benjamine Sprague, DO;  Location: [facility] ORS;  Service: General;  Laterality: Right;   TUBAL LIGATION      SOCIAL HISTORY: Social History   Socioeconomic History   Marital status: Married    Spouse name: Not on file   Number of children: Not on file   Years of education: Not on file   Highest education level: Not on file  Occupational History   Not on file  Tobacco Use   Smoking status: Never   Smokeless tobacco: Never  Vaping Use   Vaping Use: Never used  Substance and Sexual Activity   Alcohol use: Yes    Comment: 1 GLASS WINE A WEEK    Drug use: Never   Sexual activity: Not on file  Other Topics Concern   Not on file  Social History Narrative   Not on file   Social Determinants of Health   Financial Resource Strain: Not on file  Food Insecurity: Not on file  Transportation Needs: Not on file  Physical Activity: Not on file  Stress: Not on file  Social Connections: Not on file  Intimate Partner Violence: Not on file    FAMILY HISTORY: Family History  Problem Relation Age of Onset   Non-Hodgkin's lymphoma Mother    Heart attack Father    Hypertension Brother    Breast cancer Neg Hx    Colon cancer Neg Hx     ALLERGIES:  is allergic to wasp venom protein.  MEDICATIONS:  Current Outpatient Medications  Medication Sig Dispense Refill   anastrozole (ARIMIDEX) 1 MG tablet TAKE 1 TABLET BY MOUTH  DAILY 90 tablet 3   Biotin 5 MG TBDP Take 5,000 mcg by mouth daily.     calcium citrate-vitamin D (CITRACAL+D) 315-200 MG-UNIT tablet Take by mouth.     Cholecalciferol 25 MCG (1000 UT) tablet Take 1,000 Units by mouth daily.     cyanocobalamin 1000 MCG tablet Take 2,500 mcg by mouth.      Magnesium 250 MG TABS Take 250 mg by mouth at bedtime as needed.      mupirocin ointment (BACTROBAN) 2 % Apply 1 application topically daily.  Apply daily to area behind left knee and cover with band aid 22 g 0   naproxen sodium (ALEVE) 220 MG tablet Take by mouth.     simvastatin (ZOCOR) 40 MG tablet Take 40 mg by mouth every evening.      vitamin C (ASCORBIC ACID) 250 MG tablet Take 250 mg by mouth daily.     No current facility-administered medications for this visit.     PHYSICAL EXAMINATION: ECOG PERFORMANCE STATUS: 1 - Symptomatic but completely ambulatory Vitals:   06/12/21 1009  BP: 119/75  Pulse: 66  Temp: (!) 96.8 F (36 C)   Filed Weights   06/12/21 1009  Weight: 120 lb (54.4 kg)    Physical Exam Constitutional:      General: She is not in acute distress. HENT:     Head: Normocephalic and atraumatic.  Eyes:     General: No scleral icterus.    Pupils: Pupils are equal, round,  and reactive to light.  Cardiovascular:     Rate and Rhythm: Normal rate and regular rhythm.     Heart sounds: Normal heart sounds.  Pulmonary:     Effort: Pulmonary effort is normal. No respiratory distress.     Breath sounds: No wheezing.  Abdominal:     General: Bowel sounds are normal.     Palpations: Abdomen is soft.  Musculoskeletal:        General: No deformity. Normal range of motion.     Cervical back: Normal range of motion and neck supple.  Skin:    General: Skin is warm and dry.     Findings: No erythema or rash.  Neurological:     Mental Status: She is alert and oriented to person, place, and time. Mental status is at baseline.     Cranial Nerves: No cranial nerve deficit.     Coordination: Coordination normal.  Psychiatric:        Mood and Affect: Mood normal.  Patient declined breast examination.    LABORATORY DATA:  I have reviewed the data as listed Lab Results  Component Value Date   WBC 5.7 06/12/2021   HGB 12.8 06/12/2021   HCT 39.1 06/12/2021   MCV 95.8 06/12/2021   PLT 284 06/12/2021   Recent Labs    12/12/20 1025 06/12/21 0953  NA 139 137  K 3.9 3.7  CL 104 104  CO2 29 27   GLUCOSE 102* 97  BUN 25* 26*  CREATININE 0.70 0.78  CALCIUM 9.2 9.0  GFRNONAA >60 >60  PROT 6.4* 6.5  ALBUMIN 4.2 3.9  AST 21 24  ALT 17 19  ALKPHOS 56 48  BILITOT 0.4 0.6    Iron/TIBC/Ferritin/ %Sat No results found for: IRON, TIBC, FERRITIN, IRONPCTSAT     ASSESSMENT & PLAN:  1. History of ductal carcinoma in situ (DCIS) of breast   2. Aromatase inhibitor use   3. Localized osteoporosis without current pathological fracture    #History of DCIS Labs are reviewed and discussed with patient. Continue Arimidex 1 mg a day. Obtain annual bilateral screening mammogram.  In June.   Osteoporosis, in the context of aromatase inhibitor use. Continue calcium and vitamin D supplementation. Proceed with Zometa 30m every 6 months-today Recommend bone density every 2 years.  She is overdue.  Recommend patient to discuss with primary care provider to have a bone density repeated.  Follow-up in 6 months.  ZEarlie Server MD, PhD Hematology Oncology  06/12/2021

## 2021-06-12 NOTE — Patient Instructions (Signed)

## 2021-06-12 NOTE — Progress Notes (Signed)
Patient here for follow up. Patient complains of pain in left shoulder when she wakeup in the morning.  ?

## 2021-10-01 ENCOUNTER — Ambulatory Visit: Payer: Medicare Other | Admitting: Dermatology

## 2021-10-01 ENCOUNTER — Encounter: Payer: Self-pay | Admitting: Dermatology

## 2021-10-01 DIAGNOSIS — Z86006 Personal history of melanoma in-situ: Secondary | ICD-10-CM | POA: Diagnosis not present

## 2021-10-01 DIAGNOSIS — Z85828 Personal history of other malignant neoplasm of skin: Secondary | ICD-10-CM

## 2021-10-01 DIAGNOSIS — D1800 Hemangioma unspecified site: Secondary | ICD-10-CM

## 2021-10-01 DIAGNOSIS — Z1283 Encounter for screening for malignant neoplasm of skin: Secondary | ICD-10-CM

## 2021-10-01 DIAGNOSIS — L570 Actinic keratosis: Secondary | ICD-10-CM

## 2021-10-01 DIAGNOSIS — L821 Other seborrheic keratosis: Secondary | ICD-10-CM

## 2021-10-01 DIAGNOSIS — B351 Tinea unguium: Secondary | ICD-10-CM | POA: Diagnosis not present

## 2021-10-01 DIAGNOSIS — L814 Other melanin hyperpigmentation: Secondary | ICD-10-CM

## 2021-10-01 DIAGNOSIS — L578 Other skin changes due to chronic exposure to nonionizing radiation: Secondary | ICD-10-CM

## 2021-10-01 DIAGNOSIS — D229 Melanocytic nevi, unspecified: Secondary | ICD-10-CM

## 2021-10-01 MED ORDER — TERBINAFINE HCL 250 MG PO TABS: 250.0000 mg | ORAL_TABLET | ORAL | 0 refills | Status: DC

## 2021-10-01 NOTE — Patient Instructions (Addendum)
Terbinafine Counseling  Terbinafine is an anti-fungal medicine that can be applied to the skin (over the counter) or taken by mouth (prescription) to treat fungal infections. The pill version is often used to treat fungal infections of the nails or scalp. While most people do not have any side effects from taking terbinafine pills, some possible side effects of the medicine can include taste changes, headache, loss of smell, vision changes, nausea, vomiting, or diarrhea.   Rare side effects can include irritation of the liver, allergic reaction, or decrease in blood counts (which may show up as not feeling well or developing an infection). If you are concerned about any of these side effects, please stop the medicine and call your doctor, or in the case of an emergency such as feeling very unwell, seek immediate medical care.      Recommend taking Heliocare sun protection supplement daily in sunny weather for additional sun protection. For maximum protection on the sunniest days, you can take up to 2 capsules of regular Heliocare OR take 1 capsule of Heliocare Ultra. For prolonged exposure (such as a full day in the sun), you can repeat your dose of the supplement 4 hours after your first dose. Heliocare can be purchased at [employer], at some [facility] or at [email or web address].      Melanoma ABCDEs  Melanoma is the most dangerous type of skin cancer, and is the leading cause of death from skin disease.  You are more likely to develop melanoma if you: Have light-colored skin, light-colored eyes, or red or blond hair Spend a lot of time in the sun Tan regularly, either outdoors or in a tanning bed Have had blistering sunburns, especially during childhood Have a close family member who has had a melanoma Have atypical moles or large birthmarks  Early detection of melanoma is key since treatment is typically straightforward and cure rates are extremely high if we catch it early.   The  first sign of melanoma is often a change in a mole or a new dark spot.  The ABCDE system is a way of remembering the signs of melanoma.  A for asymmetry:  The two halves do not match. B for border:  The edges of the growth are irregular. C for color:  A mixture of colors are present instead of an even brown color. D for diameter:  Melanomas are usually (but not always) greater than 45m - the size of a pencil eraser. E for evolution:  The spot keeps changing in size, shape, and color.  Please check your skin once per month between visits. You can use a small mirror in front and a large mirror behind you to keep an eye on the back side or your body.   If you see any new or changing lesions before your next follow-up, please call to schedule a visit.  Please continue daily skin protection including broad spectrum sunscreen SPF 30+ to sun-exposed areas, reapplying every 2 hours as needed when you're outdoors.   Staying in the shade or wearing long sleeves, sun glasses (UVA+UVB protection) and wide brim hats (4-inch brim around the entire circumference of the hat) are also recommended for sun protection.    Due to recent changes in healthcare laws, you may see results of your pathology and/or laboratory studies on MyChart before the doctors have had a chance to review them. We understand that in some cases there may be results that are confusing or concerning to you. Please understand that  not all results are received at the same time and often the doctors may need to interpret multiple results in order to provide you with the best plan of care or course of treatment. Therefore, we ask that you please give [place] 2 business days to thoroughly review all your results before contacting the office for clarification. Should we see a critical lab result, you will be contacted sooner.   If You Need Anything After Your Visit  If you have any questions or concerns for your doctor, please call our main line at  [phone] and press option 4 to reach your doctor's medical assistant. If no one answers, please leave a voicemail as directed and we will return your call as soon as possible. Messages left after 4 pm will be answered the following business day.   You may also send [place] a message via [place]. We typically respond to MyChart messages within 1-2 business days.  For prescription refills, please ask your pharmacy to contact our office. Our fax number is [phone].  If you have an urgent issue when the clinic is closed that cannot wait until the next business day, you can page your doctor at the number below.    Please note that while we do our best to be available for urgent issues outside of office hours, we are not available 24/7.   If you have an urgent issue and are unable to reach [place], you may choose to seek medical care at your doctor's office, retail clinic, urgent care center, or emergency room.  If you have a medical emergency, please immediately call 911 or go to the emergency department.  Pager Numbers  - Dr. Nehemiah Massed: [phone]  - Dr. Laurence Ferrari: [phone]  - Dr. Nicole Kindred: [phone]  In the event of inclement weather, please call our main line at [phone] for an update on the status of any delays or closures.  Dermatology Medication Tips: Please keep the boxes that topical medications come in in order to help keep track of the instructions about where and how to use these. Pharmacies typically print the medication instructions only on the boxes and not directly on the medication tubes.   If your medication is too expensive, please contact our office at [phone] option 4 or send [place] a message through [place].   We are unable to tell what your co-pay for medications will be in advance as this is different depending on your insurance coverage. However, we may be able to find a substitute medication at lower cost or fill out paperwork to get insurance to cover a needed  medication.   If a prior authorization is required to get your medication covered by your insurance company, please allow [place] 1-2 business days to complete this process.  Drug prices often vary depending on where the prescription is filled and some pharmacies may offer cheaper prices.  The website www.goodrx.com contains coupons for medications through different pharmacies. The prices here do not account for what the cost may be with help from insurance (it may be cheaper with your insurance), but the website can give you the price if you did not use any insurance.  - You can print the associated coupon and take it with your prescription to the pharmacy.  - You may also stop by our office during regular business hours and pick up a GoodRx coupon card.  - If you need your prescription sent electronically to a different pharmacy, notify our office through [facility] or by phone  at [phone] option 4.     Si Usted Necesita Algo Despus de Su Visita  Tambin puede enviarnos un mensaje a travs de Pharmacist, community. Por lo general respondemos a los mensajes de MyChart en el transcurso de 1 a 2 das hbiles.  Para renovar recetas, por favor pida a su farmacia que se ponga en contacto con nuestra oficina. Harland Dingwall de fax es [place] [phone].  Si tiene un asunto urgente cuando la clnica est cerrada y que no puede esperar hasta el siguiente da hbil, puede llamar/localizar a su doctor(a) al nmero que aparece a continuacin.   Por favor, tenga en cuenta que aunque hacemos todo lo posible para estar disponibles para asuntos urgentes fuera del horario de [place], no estamos disponibles las 24 horas del da, los 7 das de la [place].   Si tiene un problema urgente y no puede comunicarse con nosotros, puede optar por buscar atencin mdica  en el consultorio de su doctor(a), en una clnica privada, en un centro de atencin urgente o en una sala de emergencias.  Si tiene Engineering geologist,  por favor llame inmediatamente al 911 o vaya a la sala de emergencias.  Nmeros de bper  - Dr. Nehemiah Massed: [phone]  - Dra. Moye: [phone]  - Dra. Nicole Kindred: [phone]  En caso de inclemencias del [place], por favor llame a Johnsie Kindred principal al [phone] para una actualizacin sobre el [place] de cualquier retraso o cierre.  Consejos para la medicacin en dermatologa: Por favor, guarde las cajas en las que vienen los medicamentos de uso tpico para ayudarle a seguir las instrucciones sobre dnde y cmo usarlos. Las farmacias generalmente imprimen las instrucciones del medicamento slo en las cajas y no directamente en los tubos del [place].   Si su medicamento es muy caro, por favor, pngase en contacto con Zigmund Daniel llamando al [phone] y presione la opcin 4 o envenos un mensaje a travs de Pharmacist, community.   No podemos decirle cul ser su copago por los medicamentos por adelantado ya que esto es diferente dependiendo de la cobertura de su seguro. Sin embargo, es posible que podamos encontrar un medicamento sustituto a Electrical engineer un formulario para que el seguro cubra el medicamento que se considera necesario.   Si se requiere una autorizacin previa para que su compaa de seguros [place] su medicamento, por favor permtanos de 1 a 2 das hbiles para completar este proceso.  Los precios de los medicamentos varan con frecuencia dependiendo del Environmental consultant de dnde se surte la receta y alguna farmacias pueden ofrecer precios ms baratos.  El sitio web www.goodrx.com tiene cupones para medicamentos de Airline pilot. Los precios aqu no tienen en cuenta lo que podra costar con la ayuda del seguro (puede ser ms barato con su seguro), pero el sitio web puede darle el precio si no utiliz Research scientist (physical sciences).  - Puede imprimir el cupn correspondiente y llevarlo con su receta a la farmacia.  - Tambin puede pasar por nuestra oficina durante el horario de atencin  regular y Charity fundraiser una tarjeta de cupones de GoodRx.  - Si necesita que su receta se enve electrnicamente a una farmacia diferente, informe a nuestra oficina a travs de MyChart de [place] o por telfono llamando al [phone] y presione la opcin 4.

## 2021-10-01 NOTE — Progress Notes (Signed)
Follow-Up Visit   Subjective  Emma Aguirre is a 86 y.o. female who presents for the following: Annual Exam (Tbse, hx of aks, hx of scc, hx of bcc, and hx of tinea unguium at toenails. Patient reports she would like discussing restarting terbinafine to help with toenail fungus.  ).  The patient presents for Total-Body Skin Exam (TBSE) for skin cancer screening and mole check.  The patient has spots, moles and lesions to be evaluated, some may be new or changing and the patient has concerns that these could be cancer.   The following portions of the chart were reviewed this encounter and updated as appropriate:  Tobacco  Allergies  Meds  Problems  Med Hx  Surg Hx  Fam Hx      Review of Systems: No other skin or systemic complaints except as noted in HPI or Assessment and Plan.   Objective  Well appearing patient in no apparent distress; mood and affect are within normal limits.  A full examination was performed including scalp, head, eyes, ears, nose, lips, neck, chest, axillae, abdomen, back, buttocks, bilateral upper extremities, bilateral lower extremities, hands, feet, fingers, toes, fingernails, and toenails. All findings within normal limits unless otherwise noted below.  b/l toenails Distal toenails thickened with subungual debris  L hand x 1 Erythematous thin papules/macules with gritty scale.    Assessment & Plan  Tinea unguium b/l toenails  Patient would like to restart terbinafine treatment  Reviewed previous labs from 06/12/21 CMP and CBC with Diff - normal results  Start Terbinafine 250 mg tablet - take 1 tablet by mouth as directed for 28 days. Take 1 tablet daily for 7 days at the beginnings of months 1,3,5, and 7.   Recommend pulse dose terbinafine to be taken for 7 days every other month for 7 months (Take for 7 days at the beginnings of months 1, 3, 5, and 7; dispense 28 tablets).    Terbinafine is an anti-fungal medicine that can be applied to the skin  (over the counter) or taken by mouth (prescription) to treat fungal infections. The pill version is often used to treat fungal infections of the nails or scalp. While most people do not have any side effects from taking terbinafine pills, some possible side effects of the medicine can include taste changes, headache, loss of smell, vision changes, nausea, vomiting, or diarrhea.    Rare side effects can include irritation of the liver, allergic reaction, or decrease in blood counts (which may show up as not feeling well or developing an infection). If you are concerned about any of these side effects, please stop the medicine and call your doctor, or in the case of an emergency such as feeling very unwell, seek immediate medical care.    terbinafine (LAMISIL) 250 MG tablet - b/l toenails Take 1 tablet (250 mg total) by mouth as directed for 28 doses. Take 1 tablet daily for 7 days at the beginnings of months 1, 3, 5, and 7  Actinic keratosis L hand x 1  Actinic keratoses are precancerous spots that appear secondary to cumulative UV radiation exposure/sun exposure over time. They are chronic with expected duration over 1 year. A portion of actinic keratoses will progress to squamous cell carcinoma of the skin. It is not possible to reliably predict which spots will progress to skin cancer and so treatment is recommended to prevent development of skin cancer.  Recommend daily broad spectrum sunscreen SPF 30+ to sun-exposed areas, reapply every 2  hours as needed.  Recommend staying in the shade or wearing long sleeves, sun glasses (UVA+UVB protection) and wide brim hats (4-inch brim around the entire circumference of the hat). Call for new or changing lesions.  Destruction of lesion - L hand x 1  Destruction method: cryotherapy   Informed consent: discussed and consent obtained   Lesion destroyed using liquid nitrogen: Yes   Cryotherapy cycles:  2 Outcome: patient tolerated procedure well with no  complications   Post-procedure details: wound care instructions given   Additional details:  Prior to procedure, discussed risks of blister formation, small wound, skin dyspigmentation, or rare scar following cryotherapy. Recommend Vaseline ointment to treated areas while healing.   Lentigines - Scattered tan macules - Due to sun exposure - Benign-appearing, observe - Recommend daily broad spectrum sunscreen SPF 30+ to sun-exposed areas, reapply every 2 hours as needed. - Call for any changes  Seborrheic Keratoses - Stuck-on, waxy, tan-brown papules and/or plaques  - Benign-appearing - Discussed benign etiology and prognosis. - Observe - Call for any changes  Melanocytic Nevi - Tan-brown and/or pink-flesh-colored symmetric macules and papules - Benign appearing on exam today - Observation - Call clinic for new or changing moles - Recommend daily use of broad spectrum spf 30+ sunscreen to sun-exposed areas.   Hemangiomas - Red papules - Discussed benign nature - Observe - Call for any changes  Actinic Damage - Chronic condition, secondary to cumulative UV/sun exposure - diffuse scaly erythematous macules with underlying dyspigmentation - Recommend daily broad spectrum sunscreen SPF 30+ to sun-exposed areas, reapply every 2 hours as needed.  - Staying in the shade or wearing long sleeves, sun glasses (UVA+UVB protection) and wide brim hats (4-inch brim around the entire circumference of the hat) are also recommended for sun protection.  - Call for new or changing lesions.  History of Basal Cell Carcinoma of the Skin - No evidence of recurrence today - Recommend regular full body skin exams - Recommend daily broad spectrum sunscreen SPF 30+ to sun-exposed areas, reapply every 2 hours as needed.  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  - Call if any new or changing lesions are noted between office visits   Skin cancer screening performed today. Return in about 6 months (around 04/02/2022) for tbse, tinea unguim follow up. I, Ruthell Rummage, CMA, am acting as scribe for Forest Gleason, MD.  Documentation: I have reviewed the above documentation for accuracy and completeness, and I agree with the above.  Forest Gleason, MD

## 2021-10-02 ENCOUNTER — Ambulatory Visit
Admission: RE | Admit: 2021-10-02 | Discharge: 2021-10-02 | Disposition: A | Discharge status: Home / Self Care | Payer: Medicare Other | Source: Ambulatory Visit | Attending: Oncology | Admitting: Oncology

## 2021-10-02 DIAGNOSIS — Z86000 Personal history of in-situ neoplasm of breast: Secondary | ICD-10-CM | POA: Diagnosis not present

## 2021-10-02 DIAGNOSIS — Z1231 Encounter for screening mammogram for malignant neoplasm of breast: Secondary | ICD-10-CM | POA: Insufficient documentation

## 2021-10-06 ENCOUNTER — Encounter: Payer: Self-pay | Admitting: Dermatology

## 2021-10-15 ENCOUNTER — Other Ambulatory Visit: Payer: Self-pay | Admitting: Oncology

## 2021-12-14 ENCOUNTER — Encounter: Payer: Self-pay | Admitting: Oncology

## 2021-12-14 ENCOUNTER — Inpatient Hospital Stay: Payer: Medicare Other | Attending: Oncology

## 2021-12-14 ENCOUNTER — Inpatient Hospital Stay: Payer: Medicare Other

## 2021-12-14 ENCOUNTER — Inpatient Hospital Stay: Payer: Medicare Other | Admitting: Oncology

## 2021-12-14 VITALS — BP 121/62 | HR 75 | Temp 98.7°F | Resp 20 | Wt 120.8 lb

## 2021-12-14 DIAGNOSIS — Z853 Personal history of malignant neoplasm of breast: Secondary | ICD-10-CM | POA: Insufficient documentation

## 2021-12-14 DIAGNOSIS — Z79811 Long term (current) use of aromatase inhibitors: Secondary | ICD-10-CM | POA: Insufficient documentation

## 2021-12-14 DIAGNOSIS — Z86000 Personal history of in-situ neoplasm of breast: Secondary | ICD-10-CM | POA: Diagnosis not present

## 2021-12-14 DIAGNOSIS — M810 Age-related osteoporosis without current pathological fracture: Secondary | ICD-10-CM | POA: Diagnosis present

## 2021-12-14 DIAGNOSIS — Z807 Family history of other malignant neoplasms of lymphoid, hematopoietic and related tissues: Secondary | ICD-10-CM | POA: Insufficient documentation

## 2021-12-14 DIAGNOSIS — M8580 Other specified disorders of bone density and structure, unspecified site: Secondary | ICD-10-CM

## 2021-12-14 LAB — COMPREHENSIVE METABOLIC PANEL
ALT: 15 U/L (ref 0–44)
AST: 19 U/L (ref 15–41)
Albumin: 4 g/dL (ref 3.5–5.0)
Alkaline Phosphatase: 53 U/L (ref 38–126)
Anion gap: 7 (ref 5–15)
BUN: 23 mg/dL (ref 8–23)
CO2: 28 mmol/L (ref 22–32)
Calcium: 9.7 mg/dL (ref 8.9–10.3)
Chloride: 103 mmol/L (ref 98–111)
Creatinine, Ser: 0.9 mg/dL (ref 0.44–1.00)
GFR, Estimated: >60 mL/min (ref >60)
Glucose, Bld: 95 mg/dL (ref 70–99)
Potassium: 4.2 mmol/L (ref 3.5–5.1)
Sodium: 138 mmol/L (ref 135–145)
Total Bilirubin: 0.5 mg/dL (ref 0.3–1.2)
Total Protein: 6.7 g/dL (ref 6.5–8.1)

## 2021-12-14 LAB — CBC WITH DIFFERENTIAL/PLATELET
Abs Immature Granulocytes: 0.02 10*3/uL (ref 0.00–0.07)
Basophils Absolute: 0.1 10*3/uL (ref 0.0–0.1)
Basophils Relative: 1 %
Eosinophils Absolute: 0.1 10*3/uL (ref 0.0–0.5)
Eosinophils Relative: 2 %
HCT: 38.8 % (ref 36.0–46.0)
Hemoglobin: 13 g/dL (ref 12.0–15.0)
Immature Granulocytes: 0 %
Lymphocytes Relative: 13 %
Lymphs Abs: 1.2 10*3/uL (ref 0.7–4.0)
MCH: 31.6 pg (ref 26.0–34.0)
MCHC: 33.5 g/dL (ref 30.0–36.0)
MCV: 94.2 fL (ref 80.0–100.0)
Monocytes Absolute: 0.8 10*3/uL (ref 0.1–1.0)
Monocytes Relative: 9 %
Neutro Abs: 6.8 10*3/uL (ref 1.7–7.7)
Neutrophils Relative %: 75 %
Platelets: 323 10*3/uL (ref 150–400)
RBC: 4.12 MIL/uL (ref 3.87–5.11)
RDW: 13.1 % (ref 11.5–15.5)
WBC: 9 10*3/uL (ref 4.0–10.5)
nRBC: 0 % (ref 0.0–0.2)

## 2021-12-14 MED ORDER — SODIUM CHLORIDE 0.9 % IV SOLN
Freq: Once | INTRAVENOUS | Status: AC
  Filled 2021-12-14: qty 250

## 2021-12-14 MED ORDER — ZOLEDRONIC ACID 4 MG/5ML IV CONC
3.3000 mg | Freq: Once | INTRAVENOUS | Status: AC
  Administered 2021-12-14: 3.3 mg via INTRAVENOUS
  Filled 2021-12-14: qty 4.13

## 2021-12-14 NOTE — Progress Notes (Signed)
Hematology/Oncology Progress note [phone] [phone]      Patient Care Team: Rusty Aus, MD as PCP - General (Internal Medicine)  REFERRING PROVIDER: Rusty Aus, MD REASON FOR VISIT:  Follow-up and management for DCIS.  HISTORY OF PRESENTING ILLNESS:  Emma Aguirre is a  86 y.o.  female with PMH listed below who was referred to me for evaluation of DCIS.  Patient is a retired Therapist, sports.  Patient had diagnostic mammogram right, on 01/13/2018 to follow up right upper quadrant calcifications.  Mammogram showed right upper quadrant 81m group of calcification, for which stereotactic core needle biopsy was performed.   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    Family history: denies any breast cancer or ovarian cancer OCP use: report remote use of OCP Estrogen and progesterone therapy: endorses history of hormone replacement therapy History of radiation to chest: denies.  Denies any breast pain, nipple discharge, breast skin changes.  #Treatment - She underwent lumpectomy of right breast on 05/02/2018. 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  Clip and biopsy site present.  Background fibrocystic changes.  No evidence of invasive carcinoma.  Size of DCIS at least 5 mm.  Margins uninvolved by DCIS.  Closest margin 6 mm. pTis(DCIS) pNx.  Hormone receptor studies were performed on previous biopsy specimen. ER> 90%, PR 51 to 90% -March 2020 finished adjuvant radiation. -April 2020 started on Arimidex 1 mg daily. -[facility] 6 months.  Discussed about other options about switching to tamoxifen and patient prefers to stay on aromatase inhibitor.   Patient takes biotin and hair loss has improved.  INTERVAL HISTORY Emma Stannardis a 86y.o. female who has above history reviewed by me today presents for follow up visit  for management of DCIS, status post lumpectomy 05/02/2018 Patient takes Arimidex.  She tolerates well.  Manageable side effects. Denies any breast concerns or bone pain.  Review of Systems  Constitutional:  Negative for chills, fever, malaise/fatigue and weight loss.  HENT:  Negative for nosebleeds and sore throat.   Eyes:  Negative for double vision, photophobia and redness.  Respiratory:  Negative for cough, shortness of breath and wheezing.   Cardiovascular:  Negative for chest pain, palpitations, orthopnea and leg swelling.  Gastrointestinal:  Negative for abdominal pain, blood in stool, nausea and vomiting.  Genitourinary:  Negative for dysuria.  Musculoskeletal:  Positive for joint pain. Negative for back pain and neck pain.  Skin:  Negative for itching and rash.  Neurological:  Negative for dizziness, tingling and tremors.  Endo/Heme/Allergies:  Negative for environmental allergies. Does not bruise/bleed easily.  Psychiatric/Behavioral:  Negative for depression and hallucinations.     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   left popliteal fossa, EDC 10/02/20   Hyperlipidemia    Personal history of radiation therapy     SURGICAL HISTORY: Past Surgical History:  Procedure Laterality Date   BREAST BIOPSY Right 01/23/2018   stereo calcs  x  clip, DUCTAL CARCINOMA IN SITU, INTERMEDIATE GRADE, WITHOUT COMEDONECROSIS   BREAST EXCISIONAL BIOPSY     BREAST LUMPECTOMY Right 05/02/2018   with radiation   BREAST LUMPECTOMY WITH NEEDLE LOCALIZATION AND AXILLARY SENTINEL LYMPH NODE BX Right 05/02/2018   Procedure:  BREAST LUMPECTOMY WITH NEEDLE LOCALIZATION AND POSSIBLE SENTINEL NODE BX;  Surgeon: Benjamine Sprague, DO;  Location: [facility] ORS;  Service: General;  Laterality: Right;   TUBAL LIGATION      SOCIAL HISTORY: Social History   Socioeconomic History   Marital status: Married    Spouse name: Not on file   Number of children: Not on file   Years of education: Not on file   Highest education level: Not on file  Occupational History   Not on file  Tobacco Use   Smoking status: Never   Smokeless tobacco: Never  Vaping Use   Vaping Use: Never used  Substance and Sexual Activity   Alcohol use: Yes    Comment: 1 GLASS WINE A WEEK    Drug use: Never   Sexual activity: Not on file  Other Topics Concern   Not on file  Social History Narrative   Not on file   Social Determinants of Health   Financial Resource Strain: Not on file  Food Insecurity: Not on file  Transportation Needs: Not on file  Physical Activity: Not on file  Stress: Not on file  Social Connections: Not on file  Intimate Partner Violence: Not on file    FAMILY HISTORY: Family History  Problem Relation Age of Onset   Non-Hodgkin's lymphoma Mother    Heart attack Father    Hypertension Brother    Breast cancer Neg Hx    Colon cancer Neg Hx     ALLERGIES:  is allergic to wasp venom protein.  MEDICATIONS:  Current Outpatient Medications  Medication Sig Dispense Refill   anastrozole (ARIMIDEX) 1 MG tablet TAKE 1 TABLET BY MOUTH DAILY 100 tablet 2   Biotin 5 MG TBDP Take 5,000 mcg by mouth daily.     calcium citrate-vitamin D (CITRACAL+D) 315-200 MG-UNIT tablet Take by mouth.     Cholecalciferol 25 MCG (1000 UT) tablet Take 1,000 Units by mouth daily.     cyanocobalamin 1000 MCG tablet Take 2,500 mcg by mouth.      Magnesium 250 MG TABS Take 250 mg by mouth at bedtime as needed.      mupirocin ointment (BACTROBAN) 2 % Apply 1 application topically daily. Apply daily to area behind left knee and cover with band aid 22  g 0   naproxen sodium (ALEVE) 220 MG tablet Take by mouth.     simvastatin (ZOCOR) 40 MG tablet Take 40 mg by mouth every evening.      terbinafine (LAMISIL) 250 MG tablet Take 1 tablet (250 mg total) by mouth as directed for 28 doses. Take 1 tablet daily for 7 days at the beginnings of months 1, 3, 5, and 7 28 tablet 0   vitamin C (ASCORBIC ACID) 250 MG tablet Take 250 mg by mouth daily.     No current facility-administered medications for this visit.     PHYSICAL EXAMINATION: ECOG PERFORMANCE STATUS: 1 - Symptomatic but completely ambulatory Vitals:   12/14/21 1358  BP: 121/62  Pulse: 75  Resp: 20  Temp: 98.7 F (37.1 C)  SpO2: 99%   Filed Weights   12/14/21 1358  Weight: 120 lb 12.8 oz (54.8 kg)    Physical Exam Constitutional:  General: She is not in acute distress. HENT:     Head: Normocephalic and atraumatic.  Eyes:     General: No scleral icterus.    Pupils: Pupils are equal, round, and reactive to light.  Cardiovascular:     Rate and Rhythm: Normal rate and regular rhythm.     Heart sounds: Normal heart sounds.  Pulmonary:     Effort: Pulmonary effort is normal. No respiratory distress.     Breath sounds: No wheezing.  Abdominal:     General: Bowel sounds are normal.     Palpations: Abdomen is soft.  Musculoskeletal:        General: No deformity. Normal range of motion.     Cervical back: Normal range of motion and neck supple.  Skin:    General: Skin is warm and dry.     Findings: No erythema or rash.  Neurological:     Mental Status: She is alert and oriented to person, place, and time. Mental status is at baseline.     Cranial Nerves: No cranial nerve deficit.     Coordination: Coordination normal.  Psychiatric:        Mood and Affect: Mood normal.    Breast exam was performed in seated and lying down position. Patient is status post right breast lumpectomy with a well-healed surgical scar with tissue thickening around the surgical scar..  No  palpable axillary lymphadenopathy bilaterally.  No palpable breast mass on the left side.  both nipples are chronically retracted     LABORATORY DATA:  I have reviewed the data as listed Lab Results  Component Value Date   WBC 9.0 12/14/2021   HGB 13.0 12/14/2021   HCT 38.8 12/14/2021   MCV 94.2 12/14/2021   PLT 323 12/14/2021   Recent Labs    06/12/21 0953 12/14/21 1344  NA 137 138  K 3.7 4.2  CL 104 103  CO2 27 28  GLUCOSE 97 95  BUN 26* 23  CREATININE 0.78 0.90  CALCIUM 9.0 9.7  GFRNONAA >60 >60  PROT 6.5 6.7  ALBUMIN 3.9 4.0  AST 24 19  ALT 19 15  ALKPHOS 48 53  BILITOT 0.6 0.5    Iron/TIBC/Ferritin/ %Sat No results found for: "IRON", "TIBC", "FERRITIN", "IRONPCTSAT"     ASSESSMENT & PLAN:  1. History of ductal carcinoma in situ (DCIS) of breast   2. Osteoporosis without current pathological fracture, unspecified osteoporosis type   3. Aromatase inhibitor use    #History of DCIS Labs are reviewed and discussed with patient. Continue Arimidex 1 mg a day.-Plan 5 years, till April 2025 10/05/2021, screening mammogram bilaterally showed no mammographic evidence of malignancy.   Osteoporosis, in the context of aromatase inhibitor use. 06/24/2021, bone density results reviewed. Continue calcium and vitamin D supplementation  Proceed with Zometa 4 mg every 6 months  Follow-up in 6 months.  Earlie Server, MD, PhD Hematology Oncology  12/14/2021

## 2021-12-31 ENCOUNTER — Encounter: Payer: Self-pay | Admitting: Radiation Oncology

## 2021-12-31 ENCOUNTER — Ambulatory Visit
Admission: RE | Admit: 2021-12-31 | Discharge: 2021-12-31 | Disposition: A | Discharge status: Home / Self Care | Payer: Medicare Other | Source: Ambulatory Visit | Attending: Radiation Oncology | Admitting: Radiation Oncology

## 2021-12-31 VITALS — BP 115/71 | HR 79 | Temp 97.3°F | Resp 12 | Wt 131.4 lb

## 2021-12-31 DIAGNOSIS — D0511 Intraductal carcinoma in situ of right breast: Secondary | ICD-10-CM | POA: Diagnosis present

## 2021-12-31 DIAGNOSIS — Z170 Estrogen receptor positive status [ER+]: Secondary | ICD-10-CM | POA: Diagnosis not present

## 2021-12-31 DIAGNOSIS — Z923 Personal history of irradiation: Secondary | ICD-10-CM | POA: Diagnosis not present

## 2021-12-31 DIAGNOSIS — Z79811 Long term (current) use of aromatase inhibitors: Secondary | ICD-10-CM | POA: Insufficient documentation

## 2021-12-31 NOTE — Progress Notes (Signed)
Radiation Oncology Follow up Note  Name: Emma Aguirre   Date:   12/31/2021 MRN:  [id number] DOB: 1935/05/07    This 86 y.o. female presents to the clinic today for 3-1/2-year follow-up status post whole breast radiation to her right breast for ER/PR positive ductal carcinoma in situ.  REFERRING PROVIDER: Rusty Aus, MD  HPI: Patient is a 86 year old female now out 3-1/2 years having completed whole breast radiation to her right breast for ER positive ductal carcinoma in situ.  Seen today in routine follow-up she is doing well.  She specifically denies breast tenderness cough or bone pain..  She is currently on Arimidex tolerating it well without side effect.  She had mammograms back in June which I have reviewed were BI-RADS 1 negative.  COMPLICATIONS OF TREATMENT: none  FOLLOW UP COMPLIANCE: keeps appointments   PHYSICAL EXAM:  BP 115/71 (BP Location: Right Arm, Patient Position: Sitting, Cuff Size: Normal)   Pulse 79   Temp (!) 97.3 F (36.3 C) (Tympanic)   Resp 12   Wt 131 lb 6.4 oz (59.6 kg)   BMI 24.03 kg/m  Lungs are clear to A&P cardiac examination essentially unremarkable with regular rate and rhythm. No dominant mass or nodularity is noted in either breast in 2 positions examined. Incision is well-healed. No axillary or supraclavicular adenopathy is appreciated. Cosmetic result is excellent.  Well-developed well-nourished patient in NAD. HEENT reveals PERLA, EOMI, discs not visualized.  Oral cavity is clear. No oral mucosal lesions are identified. Neck is clear without evidence of cervical or supraclavicular adenopathy. Lungs are clear to A&P. Cardiac examination is essentially unremarkable with regular rate and rhythm without murmur rub or thrill. Abdomen is benign with no organomegaly or masses noted. Motor sensory and DTR levels are equal and symmetric in the upper and lower extremities. Cranial nerves II through XII are grossly intact. Proprioception is intact. No  peripheral adenopathy or edema is identified. No motor or sensory levels are noted. Crude visual fields are within normal range.  RADIOLOGY RESULTS: Mammograms reviewed compatible with above-stated findings  PLAN: Present time patient is doing well with no evidence of disease now out over 3-1/2 years.  I am going to discontinue follow-up care.  She continues follow-up care with medical oncology.  Knows to call with any concerns at any time.  I would like to take this opportunity to thank you for allowing me to participate in the care of your patient.Noreene Filbert, MD

## 2022-04-08 ENCOUNTER — Ambulatory Visit: Payer: Medicare Other | Admitting: Dermatology

## 2022-05-15 ENCOUNTER — Encounter: Payer: Self-pay | Admitting: Oncology

## 2022-06-07 ENCOUNTER — Encounter: Payer: Self-pay | Admitting: Oncology

## 2022-06-14 ENCOUNTER — Encounter: Payer: Self-pay | Admitting: Oncology

## 2022-06-14 ENCOUNTER — Inpatient Hospital Stay: Payer: Medicare HMO | Admitting: Oncology

## 2022-06-14 ENCOUNTER — Inpatient Hospital Stay: Payer: Medicare HMO

## 2022-06-14 ENCOUNTER — Inpatient Hospital Stay: Payer: Medicare HMO | Attending: Oncology

## 2022-06-14 VITALS — BP 126/71 | HR 67 | Temp 96.9°F | Resp 18 | Wt 119.5 lb

## 2022-06-14 DIAGNOSIS — Z807 Family history of other malignant neoplasms of lymphoid, hematopoietic and related tissues: Secondary | ICD-10-CM | POA: Insufficient documentation

## 2022-06-14 DIAGNOSIS — Z85828 Personal history of other malignant neoplasm of skin: Secondary | ICD-10-CM | POA: Insufficient documentation

## 2022-06-14 DIAGNOSIS — Z79811 Long term (current) use of aromatase inhibitors: Secondary | ICD-10-CM | POA: Diagnosis not present

## 2022-06-14 DIAGNOSIS — D0511 Intraductal carcinoma in situ of right breast: Secondary | ICD-10-CM

## 2022-06-14 DIAGNOSIS — M810 Age-related osteoporosis without current pathological fracture: Secondary | ICD-10-CM

## 2022-06-14 DIAGNOSIS — Z86000 Personal history of in-situ neoplasm of breast: Secondary | ICD-10-CM

## 2022-06-14 LAB — CBC WITH DIFFERENTIAL/PLATELET
Abs Immature Granulocytes: 0.03 10*3/uL (ref 0.00–0.07)
Basophils Absolute: 0.1 10*3/uL (ref 0.0–0.1)
Basophils Relative: 1 %
Eosinophils Absolute: 0.2 10*3/uL (ref 0.0–0.5)
Eosinophils Relative: 3 %
HCT: 39.3 % (ref 36.0–46.0)
Hemoglobin: 12.7 g/dL (ref 12.0–15.0)
Immature Granulocytes: 1 %
Lymphocytes Relative: 19 %
Lymphs Abs: 1.2 10*3/uL (ref 0.7–4.0)
MCH: 30.6 pg (ref 26.0–34.0)
MCHC: 32.3 g/dL (ref 30.0–36.0)
MCV: 94.7 fL (ref 80.0–100.0)
Monocytes Absolute: 0.7 10*3/uL (ref 0.1–1.0)
Monocytes Relative: 10 %
Neutro Abs: 4.4 10*3/uL (ref 1.7–7.7)
Neutrophils Relative %: 66 %
Platelets: 291 10*3/uL (ref 150–400)
RBC: 4.15 MIL/uL (ref 3.87–5.11)
RDW: 12.9 % (ref 11.5–15.5)
WBC: 6.5 10*3/uL (ref 4.0–10.5)
nRBC: 0 % (ref 0.0–0.2)

## 2022-06-14 LAB — COMPREHENSIVE METABOLIC PANEL
ALT: 20 U/L (ref 0–44)
AST: 25 U/L (ref 15–41)
Albumin: 4.2 g/dL (ref 3.5–5.0)
Alkaline Phosphatase: 54 U/L (ref 38–126)
Anion gap: 7 (ref 5–15)
BUN: 21 mg/dL (ref 8–23)
CO2: 27 mmol/L (ref 22–32)
Calcium: 9.6 mg/dL (ref 8.9–10.3)
Chloride: 104 mmol/L (ref 98–111)
Creatinine, Ser: 0.87 mg/dL (ref 0.44–1.00)
GFR, Estimated: >60 mL/min (ref >60)
Glucose, Bld: 87 mg/dL (ref 70–99)
Potassium: 3.9 mmol/L (ref 3.5–5.1)
Sodium: 138 mmol/L (ref 135–145)
Total Bilirubin: 0.6 mg/dL (ref 0.3–1.2)
Total Protein: 7.1 g/dL (ref 6.5–8.1)

## 2022-06-14 MED ORDER — SODIUM CHLORIDE 0.9 % IV SOLN
INTRAVENOUS | Status: DC
  Filled 2022-06-14: qty 250

## 2022-06-14 MED ORDER — ANASTROZOLE 1 MG PO TABS: 1.0000 mg | ORAL_TABLET | Freq: Every day | ORAL | 3 refills | Status: AC

## 2022-06-14 MED ORDER — ZOLEDRONIC ACID 4 MG/5ML IV CONC
3.3000 mg | Freq: Once | INTRAVENOUS | Status: AC
  Administered 2022-06-14: 3.3 mg via INTRAVENOUS
  Filled 2022-06-14: qty 4.13

## 2022-06-14 NOTE — Progress Notes (Signed)
Hematology/Oncology Progress note [phone] [phone]    REASON FOR VISIT:  Follow-up and management for DCIS.  ASSESSMENT & PLAN:   Ductal carcinoma in situ (DCIS) of right breast History of DCIS Labs are reviewed and discussed with patient. Continue Arimidex 1 mg a day.-Plan 5 years, till April 2025 10/05/2021, screening mammogram bilaterally showed no mammographic evidence of malignancy. Repeat mammogram in July 2024  Osteoporosis Osteoporosis, in the context of aromatase inhibitor use. 06/24/2021, bone density results reviewed. Continue calcium and vitamin D supplementation  Proceed with Zometa every 6 months  Orders Placed This Encounter  Procedures   MM 3D SCREENING MAMMOGRAM BILATERAL BREAST    Standing Status:   Future    Standing Expiration Date:   06/14/2023    Order Specific Question:   Reason for Exam (SYMPTOM  OR DIAGNOSIS REQUIRED)    Answer:   Breast cancer    Order Specific Question:   Preferred imaging location?    Answer:   [facility]   CBC with Differential ([place] Only)    Standing Status:   Future    Standing Expiration Date:   06/14/2023   CMP ([place] only)    Standing Status:   Future    Standing Expiration Date:   06/14/2023   Follow  up in 6 months.  All questions were answered. The patient knows to call the clinic with any problems, questions or concerns.  Emma Server, MD, PhD [facility] Hematology Oncology 06/14/2022   HISTORY OF PRESENTING ILLNESS:  Emma Aguirre is a  87 y.o.  female with PMH listed below who was referred to me for evaluation of DCIS.  Patient is a retired Therapist, sports.  Patient had diagnostic mammogram right, on 01/13/2018 to follow up right upper quadrant calcifications.  Mammogram showed right upper quadrant 62m group of calcification, for which stereotactic core needle biopsy was performed.   Biopsy pathology showed: right upper outer quadrant calcification showed DCIS, intermediate grade,  without comedonecrosis.    Family history: denies any breast cancer or ovarian cancer OCP use: report remote use of OCP Estrogen and progesterone therapy: endorses history of hormone replacement therapy History of radiation to chest: denies.  Denies any breast pain, nipple discharge, breast skin changes.  #Treatment - She underwent lumpectomy of right breast on 05/02/2018. 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  Clip and biopsy site present.  Background fibrocystic changes.  No evidence of invasive carcinoma.  Size of DCIS at least 5 mm.  Margins uninvolved by DCIS.  Closest margin 6 mm. pTis(DCIS) pNx.  Hormone receptor studies were performed on previous biopsy specimen. ER> 90%, PR 51 to 90% -March 2020 finished adjuvant radiation. -April 2020 started on Arimidex 1 mg daily. -[facility] Z[facility] 6 months.  Discussed about other options about switching to tamoxifen and patient prefers to stay on aromatase inhibitor.   Patient takes biotin and hair loss has improved.  INTERVAL HISTORY JWaive Moundis a 87y.o. female who has above history reviewed by me today presents for follow up visit for management of DCIS, status post lumpectomy 05/02/2018 Patient takes Arimidex.  She tolerates well.  Manageable side effects. Denies any breast concerns or bone pain.  Review of Systems  Constitutional:  Negative for chills, fever, malaise/fatigue and weight loss.  HENT:  Negative for nosebleeds and sore throat.   Eyes:  Negative for double vision, photophobia and redness.  Respiratory:  Negative for cough, shortness of  breath and wheezing.   Cardiovascular:  Negative for chest pain, palpitations, orthopnea and leg swelling.  Gastrointestinal:  Negative for abdominal pain, blood in stool, nausea and vomiting.  Genitourinary:  Negative for dysuria.  Musculoskeletal:  Positive for joint pain. Negative for back pain and neck pain.  Skin:   Negative for itching and rash.  Neurological:  Negative for dizziness, tingling and tremors.  Endo/Heme/Allergies:  Negative for environmental allergies. Does not bruise/bleed easily.  Psychiatric/Behavioral:  Negative for depression and hallucinations.     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   left popliteal fossa, EDC 10/02/20   Hyperlipidemia    Personal history of radiation therapy     SURGICAL HISTORY: Past Surgical History:  Procedure Laterality Date   BREAST BIOPSY Right 01/23/2018   stereo calcs  x clip, DUCTAL CARCINOMA IN SITU, INTERMEDIATE GRADE, WITHOUT COMEDONECROSIS   BREAST EXCISIONAL BIOPSY     BREAST LUMPECTOMY Right 05/02/2018   with radiation   BREAST LUMPECTOMY WITH NEEDLE LOCALIZATION AND AXILLARY SENTINEL LYMPH NODE BX Right 05/02/2018   Procedure: BREAST LUMPECTOMY WITH NEEDLE LOCALIZATION AND POSSIBLE SENTINEL NODE BX;  Surgeon: Emma Sprague, DO;  Location: [facility] ORS;  Service: General;  Laterality: Right;   TUBAL LIGATION      SOCIAL HISTORY: Social History   Socioeconomic History   Marital status: Married    Spouse name: Not on file   Number of children: Not on file   Years of education: Not on file   Highest education level: Not on file  Occupational History   Not on file  Tobacco Use   Smoking status: Never   Smokeless tobacco: Never  Vaping Use   Vaping Use: Never used  Substance and Sexual Activity   Alcohol use: Yes    Comment: 1 GLASS WINE A WEEK    Drug use: Never   Sexual activity: Not on file  Other Topics Concern   Not on file  Social History Narrative    Not on file   Social Determinants of Health   Financial Resource Strain: Not on file  Food Insecurity: Not on file  Transportation Needs: Not on file  Physical Activity: Not on file  Stress: Not on file  Social Connections: Not on file  Intimate Partner Violence: Not on file    FAMILY HISTORY: Family History  Problem Relation Age of Onset   Non-Hodgkin's lymphoma Mother    Heart attack Father    Hypertension Brother    Breast cancer Neg Hx    Colon cancer Neg Hx     ALLERGIES:  is allergic to wasp venom protein.  MEDICATIONS:  Current Outpatient Medications  Medication Sig Dispense Refill   Biotin 5 MG TBDP Take 5,000 mcg by mouth daily.     calcium citrate-vitamin D (CITRACAL+D) 315-200 MG-UNIT tablet Take by mouth.     Cholecalciferol 25 MCG (1000 UT) tablet Take 1,000 Units by mouth daily.     cyanocobalamin 1000 MCG tablet Take 2,500 mcg by mouth.      Magnesium 250 MG  TABS Take 250 mg by mouth at bedtime as needed.      mupirocin ointment (BACTROBAN) 2 % Apply 1 application topically daily. Apply daily to area behind left knee and cover with band aid 22 g 0   naproxen sodium (ALEVE) 220 MG tablet Take by mouth.     simvastatin (ZOCOR) 40 MG tablet Take 40 mg by mouth every evening.      vitamin C (ASCORBIC ACID) 250 MG tablet Take 250 mg by mouth daily.     anastrozole (ARIMIDEX) 1 MG tablet Take 1 tablet (1 mg total) by mouth daily. 90 tablet 3   No current facility-administered medications for this visit.   Facility-Administered Medications Ordered in Other Visits  Medication Dose Route Frequency Provider Last Rate Last Admin   0.9 %  sodium chloride infusion   Intravenous Continuous Emma Server, MD   Stopped at 06/14/22 1550     PHYSICAL EXAMINATION: ECOG PERFORMANCE STATUS: 1 - Symptomatic but completely ambulatory Vitals:   06/14/22 1351  BP: 126/71  Pulse: 67  Resp: 18  Temp: (!) 96.9 F (36.1 C)   Filed Weights   06/14/22 1351  Weight: 119 lb 8 oz  (54.2 kg)    Physical Exam Constitutional:      General: She is not in acute distress. HENT:     Head: Normocephalic and atraumatic.  Eyes:     General: No scleral icterus.    Pupils: Pupils are equal, round, and reactive to light.  Cardiovascular:     Rate and Rhythm: Normal rate and regular rhythm.     Heart sounds: Normal heart sounds.  Pulmonary:     Effort: Pulmonary effort is normal. No respiratory distress.     Breath sounds: No wheezing.  Abdominal:     General: Bowel sounds are normal.     Palpations: Abdomen is soft.  Musculoskeletal:        General: No deformity. Normal range of motion.     Cervical back: Normal range of motion and neck supple.  Skin:    General: Skin is warm and dry.     Findings: No erythema or rash.  Neurological:     Mental Status: She is alert and oriented to person, place, and time. Mental status is at baseline.     Cranial Nerves: No cranial nerve deficit.     Coordination: Coordination normal.  Psychiatric:        Mood and Affect: Mood normal.    Breast exam was performed in seated and lying down position. Patient is status post right breast lumpectomy with a well-healed surgical scar with tissue thickening and mild tenderness around the surgical scar..  No palpable axillary lymphadenopathy bilaterally.  No palpable breast mass on the left side.  both nipples are chronically retracted     LABORATORY DATA:  I have reviewed the data as listed    Latest Ref Rng & Units 06/14/2022    1:38 PM 12/14/2021    1:44 PM 06/12/2021    9:53 AM  CBC  WBC 4.0 - 10.5 K/uL 6.5  9.0  5.7   Hemoglobin 12.0 - 15.0 g/dL 12.7  13.0  12.8   Hematocrit 36.0 - 46.0 % 39.3  38.8  39.1   Platelets 150 - 400 K/uL 291  323  284       Latest Ref Rng & Units 06/14/2022    1:38 PM 12/14/2021    1:44 PM 06/12/2021    9:53 AM  CMP  Glucose 70 - 99 mg/dL 87  95  97   BUN 8 - 23 mg/dL '21  23  26   '$ Creatinine 0.44 - 1.00 mg/dL 0.87  0.90  0.78   Sodium 135 -  145 mmol/L 138  138  137   Potassium 3.5 - 5.1 mmol/L 3.9  4.2  3.7   Chloride 98 - 111 mmol/L 104  103  104   CO2 22 - 32 mmol/L '27  28  27   '$ Calcium 8.9 - 10.3 mg/dL 9.6  9.7  9.0   Total Protein 6.5 - 8.1 g/dL 7.1  6.7  6.5   Total Bilirubin 0.3 - 1.2 mg/dL 0.6  0.5  0.6   Alkaline Phos 38 - 126 U/L 54  53  48   AST 15 - 41 U/L '25  19  24   '$ ALT 0 - 44 U/L '20  15  19      '$ Iron/TIBC/Ferritin/ %Sat No results found for: "IRON", "TIBC", "FERRITIN", "IRONPCTSAT"

## 2022-06-14 NOTE — Assessment & Plan Note (Addendum)
History of DCIS Labs are reviewed and discussed with patient. Continue Arimidex 1 mg a day.-Plan 5 years, till April 2025 10/05/2021, screening mammogram bilaterally showed no mammographic evidence of malignancy. Repeat mammogram in July 2024

## 2022-06-14 NOTE — Assessment & Plan Note (Addendum)
Osteoporosis, in the context of aromatase inhibitor use. 06/24/2021, bone density results reviewed. Continue calcium and vitamin D supplementation  Proceed with Zometa every 6 months

## 2022-06-14 NOTE — Progress Notes (Signed)
Pt here for follow up. No new breast concerns.  ?

## 2022-06-24 ENCOUNTER — Ambulatory Visit: Payer: Medicare HMO | Admitting: Dermatology

## 2022-06-24 ENCOUNTER — Encounter: Payer: Self-pay | Admitting: Dermatology

## 2022-06-24 DIAGNOSIS — Z1283 Encounter for screening for malignant neoplasm of skin: Secondary | ICD-10-CM

## 2022-06-24 DIAGNOSIS — Z7189 Other specified counseling: Secondary | ICD-10-CM

## 2022-06-24 DIAGNOSIS — Z85828 Personal history of other malignant neoplasm of skin: Secondary | ICD-10-CM

## 2022-06-24 DIAGNOSIS — D229 Melanocytic nevi, unspecified: Secondary | ICD-10-CM

## 2022-06-24 DIAGNOSIS — Z86007 Personal history of in-situ neoplasm of skin: Secondary | ICD-10-CM

## 2022-06-24 DIAGNOSIS — L578 Other skin changes due to chronic exposure to nonionizing radiation: Secondary | ICD-10-CM | POA: Diagnosis not present

## 2022-06-24 DIAGNOSIS — B351 Tinea unguium: Secondary | ICD-10-CM

## 2022-06-24 MED ORDER — TERBINAFINE HCL 250 MG PO TABS: 250.0000 mg | ORAL_TABLET | Freq: Every day | ORAL | 0 refills | Status: AC

## 2022-06-24 NOTE — Patient Instructions (Addendum)
Terbinafine Counseling  Terbinafine is an anti-fungal medicine that can be applied to the skin (over the counter) or taken by mouth (prescription) to treat fungal infections. The pill version is often used to treat fungal infections of the nails or scalp. While most people do not have any side effects from taking terbinafine pills, some possible side effects of the medicine can include taste changes, headache, loss of smell, vision changes, nausea, vomiting, or diarrhea.   Rare side effects can include irritation of the liver, allergic reaction, or decrease in blood counts (which may show up as not feeling well or developing an infection). If you are concerned about any of these side effects, please stop the medicine and call your doctor, or in the case of an emergency such as feeling very unwell, seek immediate medical care.     Actinic keratoses are precancerous spots that appear secondary to cumulative UV radiation exposure/sun exposure over time. They are chronic with expected duration over 1 year. A portion of actinic keratoses will progress to squamous cell carcinoma of the skin. It is not possible to reliably predict which spots will progress to skin cancer and so treatment is recommended to prevent development of skin cancer.  Recommend daily broad spectrum sunscreen SPF 30+ to sun-exposed areas, reapply every 2 hours as needed.  Recommend staying in the shade or wearing long sleeves, sun glasses (UVA+UVB protection) and wide brim hats (4-inch brim around the entire circumference of the hat). Call for new or changing lesions.   Cryotherapy Aftercare  Wash gently with soap and water everyday.   Apply Vaseline and Band-Aid daily until healed.     Recommend taking Heliocare sun protection supplement daily in sunny weather for additional sun protection. For maximum protection on the sunniest days, you can take up to 2 capsules of regular Heliocare OR take 1 capsule of Heliocare Ultra.  For prolonged exposure (such as a full day in the sun), you can repeat your dose of the supplement 4 hours after your first dose. Heliocare can be purchased at [employer], at some [facility] or at [email or web address].      Melanoma ABCDEs  Melanoma is the most dangerous type of skin cancer, and is the leading cause of death from skin disease.  You are more likely to develop melanoma if you: Have light-colored skin, light-colored eyes, or red or blond hair Spend a lot of time in the sun Tan regularly, either outdoors or in a tanning bed Have had blistering sunburns, especially during childhood Have a close family member who has had a melanoma Have atypical moles or large birthmarks  Early detection of melanoma is key since treatment is typically straightforward and cure rates are extremely high if we catch it early.   The first sign of melanoma is often a change in a mole or a new dark spot.  The ABCDE system is a way of remembering the signs of melanoma.  A for asymmetry:  The two halves do not match. B for border:  The edges of the growth are irregular. C for color:  A mixture of colors are present instead of an even brown color. D for diameter:  Melanomas are usually (but not always) greater than 29mm - the size of a pencil eraser. E for evolution:  The spot keeps changing in size, shape, and color.  Please check your skin once per month between visits. You can use a small mirror in front and a large mirror behind you  to keep an eye on the back side or your body.   If you see any new or changing lesions before your next follow-up, please call to schedule a visit.  Please continue daily skin protection including broad spectrum sunscreen SPF 30+ to sun-exposed areas, reapplying every 2 hours as needed when you're outdoors.   Staying in the shade or wearing long sleeves, sun glasses (UVA+UVB protection) and wide brim hats (4-inch brim around the entire circumference of the hat) are  also recommended for sun protection.    Due to recent changes in healthcare laws, you may see results of your pathology and/or laboratory studies on MyChart before the doctors have had a chance to review them. We understand that in some cases there may be results that are confusing or concerning to you. Please understand that not all results are received at the same time and often the doctors may need to interpret multiple results in order to provide you with the best plan of care or course of treatment. Therefore, we ask that you please give [place] 2 business days to thoroughly review all your results before contacting the office for clarification. Should we see a critical lab result, you will be contacted sooner.   If You Need Anything After Your Visit  If you have any questions or concerns for your doctor, please call our main line at [phone] and press option 4 to reach your doctor's medical assistant. If no one answers, please leave a voicemail as directed and we will return your call as soon as possible. Messages left after 4 pm will be answered the following business day.   You may also send [place] a message via [place]. We typically respond to MyChart messages within 1-2 business days.  For prescription refills, please ask your pharmacy to contact our office. Our fax number is [phone].  If you have an urgent issue when the clinic is closed that cannot wait until the next business day, you can page your doctor at the number below.    Please note that while we do our best to be available for urgent issues outside of office hours, we are not available 24/7.   If you have an urgent issue and are unable to reach [place], you may choose to seek medical care at your doctor's office, retail clinic, urgent care center, or emergency room.  If you have a medical emergency, please immediately call 911 or go to the emergency department.  Pager Numbers  - Dr. Nehemiah Massed: [phone]  - Dr. Laurence Ferrari:  [phone]  - Dr. Nicole Kindred: [phone]  In the event of inclement weather, please call our main line at [phone] for an update on the status of any delays or closures.  Dermatology Medication Tips: Please keep the boxes that topical medications come in in order to help keep track of the instructions about where and how to use these. Pharmacies typically print the medication instructions only on the boxes and not directly on the medication tubes.   If your medication is too expensive, please contact our office at [phone] option 4 or send [place] a message through [place].   We are unable to tell what your co-pay for medications will be in advance as this is different depending on your insurance coverage. However, we may be able to find a substitute medication at lower cost or fill out paperwork to get insurance to cover a needed medication.   If a prior authorization is required to get your medication covered by your  insurance company, please allow [place] 1-2 business days to complete this process.  Drug prices often vary depending on where the prescription is filled and some pharmacies may offer cheaper prices.  The website www.goodrx.com contains coupons for medications through different pharmacies. The prices here do not account for what the cost may be with help from insurance (it may be cheaper with your insurance), but the website can give you the price if you did not use any insurance.  - You can print the associated coupon and take it with your prescription to the pharmacy.  - You may also stop by our office during regular business hours and pick up a GoodRx coupon card.  - If you need your prescription sent electronically to a different pharmacy, notify our office through [facility] or by phone at [phone] option 4.     Si Usted Necesita Algo Despus de Su Visita  Tambin puede enviarnos un mensaje a travs de Pharmacist, community. Por lo general respondemos a los mensajes de  MyChart en el transcurso de 1 a 2 das hbiles.  Para renovar recetas, por favor pida a su farmacia que se ponga en contacto con nuestra oficina. Harland Dingwall de fax es [place] [phone].  Si tiene un asunto urgente cuando la clnica est cerrada y que no puede esperar hasta el siguiente da hbil, puede llamar/localizar a su doctor(a) al nmero que aparece a continuacin.   Por favor, tenga en cuenta que aunque hacemos todo lo posible para estar disponibles para asuntos urgentes fuera del horario de [place], no estamos disponibles las 24 horas del da, los 7 das de la [place].   Si tiene un problema urgente y no puede comunicarse con nosotros, puede optar por buscar atencin mdica  en el consultorio de su doctor(a), en una clnica privada, en un centro de atencin urgente o en una sala de emergencias.  Si tiene Engineering geologist, por favor llame inmediatamente al 911 o vaya a la sala de emergencias.  Nmeros de bper  - Dr. Nehemiah Massed: [phone]  - Dra. Moye: [phone]  - Dra. Nicole Kindred: [phone]  En caso de inclemencias del [place], por favor llame a Johnsie Kindred principal al [phone] para una actualizacin sobre el [place] de cualquier retraso o cierre.  Consejos para la medicacin en dermatologa: Por favor, guarde las cajas en las que vienen los medicamentos de uso tpico para ayudarle a seguir las instrucciones sobre dnde y cmo usarlos. Las farmacias generalmente imprimen las instrucciones del medicamento slo en las cajas y no directamente en los tubos del [place].   Si su medicamento es muy caro, por favor, pngase en contacto con Zigmund Daniel llamando al [phone] y presione la opcin 4 o envenos un mensaje a travs de Pharmacist, community.   No podemos decirle cul ser su copago por los medicamentos por adelantado ya que esto es diferente dependiendo de la cobertura de su seguro. Sin embargo, es posible que podamos encontrar un medicamento sustituto a Contractor un formulario para que el seguro cubra el medicamento que se considera necesario.   Si se requiere una autorizacin previa para que su compaa de seguros [place] su medicamento, por favor permtanos de 1 a 2 das hbiles para completar este proceso.  Los precios de los medicamentos varan con frecuencia dependiendo del Environmental consultant de dnde se surte la receta y alguna farmacias pueden ofrecer precios ms baratos.  El sitio web www.goodrx.com tiene cupones para medicamentos de Airline pilot. Los precios aqu no tienen en  cuenta lo que podra costar con la ayuda del seguro (puede ser ms barato con su seguro), pero el sitio web puede darle el precio si no Field seismologist.  - Puede imprimir el cupn correspondiente y llevarlo con su receta a la farmacia.  - Tambin puede pasar por nuestra oficina durante el horario de atencin regular y Charity fundraiser una tarjeta de cupones de GoodRx.  - Si necesita que su receta se enve electrnicamente a una farmacia diferente, informe a nuestra oficina a travs de MyChart de [place] o por telfono llamando al [phone] y presione la opcin 4.

## 2022-06-24 NOTE — Progress Notes (Signed)
Follow-Up Visit   Subjective  Emma Aguirre is a 87 y.o. female who presents for the following: Skin Cancer Screening and Full Body Skin Exam Pt reports red spot at right cheek and toenail fungus did not go away.   The patient presents for Total-Body Skin Exam (TBSE) for skin cancer screening and mole check. The patient has spots, moles and lesions to be evaluated, some may be new or changing and the patient has concerns that these could be cancer.   The following portions of the chart were reviewed this encounter and updated as appropriate: medications, allergies, medical history  Review of Systems:  No other skin or systemic complaints except as noted in HPI or Assessment and Plan.  Objective  Well appearing patient in no apparent distress; mood and affect are within normal limits.  A full examination was performed including scalp, head, eyes, ears, nose, lips, neck, chest, axillae, abdomen, back, buttocks, bilateral upper extremities, bilateral lower extremities, hands, feet, fingers, toes, fingernails, and toenails. All findings within normal limits unless otherwise noted below.     Assessment & Plan   Tinea unguium b/l toenails   Chronic and persistent condition with duration or expected duration over one year. Condition is symptomatic/ bothersome to patient. Not currently at goal.  Patient would like to restart terbinafine treatment. Reviewed culture showing Trichophyton rubrum.   Reviewed CMP and CBC with diff from 06/2022  Start Terbinafine 250 mg tablet - take 1 tablet by mouth for 1 month.  Will recheck CMP and CBC with diff in 1 month - if normal will continue treatment  Will recheck in 4 months   Terbinafine Counseling  Terbinafine is an anti-fungal medicine that can be applied to the skin (over the counter) or taken by mouth (prescription) to treat fungal infections. The pill version is often used to treat fungal infections of the nails or scalp. While most  people do not have any side effects from taking terbinafine pills, some possible side effects of the medicine can include taste changes, headache, loss of smell, vision changes, nausea, vomiting, or diarrhea.   Rare side effects can include irritation of the liver, allergic reaction, or decrease in blood counts (which may show up as not feeling well or developing an infection). If you are concerned about any of these side effects, please stop the medicine and call your doctor, or in the case of an emergency such as feeling very unwell, seek immediate medical care.    ACTINIC KERATOSIS  Actinic keratoses are precancerous spots that appear secondary to cumulative UV radiation exposure/sun exposure over time. They are chronic with expected duration over 1 year. A portion of actinic keratoses will progress to squamous cell carcinoma of the skin. It is not possible to reliably predict which spots will progress to skin cancer and so treatment is recommended to prevent development of skin cancer.  Recommend daily broad spectrum sunscreen SPF 30+ to sun-exposed areas, reapply every 2 hours as needed.  Recommend staying in the shade or wearing long sleeves, sun glasses (UVA+UVB protection) and wide brim hats (4-inch brim around the entire circumference of the hat). Call for new or changing lesions.  Prior to procedure, discussed risks of blister formation, small wound, skin dyspigmentation, or rare scar following cryotherapy. Recommend Vaseline ointment to treated areas while healing.  Destruction Procedure Note Destruction method: cryotherapy   Informed consent: discussed and consent obtained   Lesion destroyed using liquid nitrogen: Yes   Cryotherapy cycles:  2 Outcome: patient  tolerated procedure well with no complications   Post-procedure details: wound care instructions given   Locations: left cheek x 2, left forearm x 1, left upper arm x 1, left medial calf x 1 # of Lesions Treated:  5   Lentigines, Seborrheic Keratoses, Hemangiomas - Benign normal skin lesions - Benign-appearing - Call for any changes  Melanocytic Nevi - Tan-brown and/or pink-flesh-colored symmetric macules and papules - Benign appearing on exam today - Observation - Call clinic for new or changing moles - Recommend daily use of broad spectrum spf 30+ sunscreen to sun-exposed areas.   Actinic Damage - Chronic condition, secondary to cumulative UV/sun exposure - diffuse scaly erythematous macules with underlying dyspigmentation - Recommend daily broad spectrum sunscreen SPF 30+ to sun-exposed areas, reapply every 2 hours as needed.  - Staying in the shade or wearing long sleeves, sun glasses (UVA+UVB protection) and wide brim hats (4-inch brim around the entire circumference of the hat) are also recommended for sun protection.  - Call for new or changing lesions.  History of Basal Cell Carcinoma of the Skin - No evidence of recurrence today - Recommend regular full body skin exams - Recommend daily broad spectrum sunscreen SPF 30+ to sun-exposed areas, reapply every 2 hours as needed.  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  - Call if any new or changing lesions are noted between office visits   Skin cancer screening performed today.  Return for 1 month tinea followup.  I, Ruthell Rummage, CMA, am acting as scribe for Forest Gleason, MD.   Documentation: I have reviewed the above documentation for accuracy and completeness, and I agree with the above.  Forest Gleason, MD

## 2022-06-29 ENCOUNTER — Encounter: Payer: Self-pay | Admitting: Dermatology

## 2022-09-07 ENCOUNTER — Encounter: Payer: Self-pay | Admitting: Oncology

## 2022-09-09 ENCOUNTER — Encounter: Payer: Self-pay | Admitting: Nurse Practitioner

## 2022-09-09 ENCOUNTER — Ambulatory Visit: Payer: Medicare HMO | Admitting: Nurse Practitioner

## 2022-09-09 VITALS — BP 134/74 | HR 71 | Temp 97.3°F | Ht 62.0 in | Wt 121.0 lb

## 2022-09-09 DIAGNOSIS — H6121 Impacted cerumen, right ear: Secondary | ICD-10-CM

## 2022-09-09 NOTE — Progress Notes (Signed)
Careteam: Patient Care Team: Emma Penton, MD as PCP - General (Internal Medicine) Emma Patience, MD as Consulting Physician (Oncology)  Advanced Directive information Does Patient Have a Medical Advance Directive?: Yes, Type of Advance Directive: Healthcare Power of [place];Living will, Does patient want to make changes to medical advance directive?: No - Patient declined  Allergies  Allergen Reactions   Wasp Venom Protein Other (See Comments)    Felt like couldn't breathe (went to Emergency Room for shot of adrenaline)    Chief Complaint  Patient presents with   Acute Visit    Decreased Hearing in Right Ear.      HPI: Patient is a 87 y.o. female seen in today at the [facility] for decrease hearing in right ear. Feels like it is stopped up. Has had this happen before and needed ear flushed.  No pain, tenderness, no congestion, runny nose or eyes.  Review of Systems:  Review of Systems  Constitutional:  Negative for chills and fever.  HENT:  Positive for hearing loss. Negative for congestion and sore throat.     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   History of squamous cell carcinoma in situ (SCCIS) 08/07/2020   left popliteal fossa, [facility] 10/02/20   Hyperlipidemia    Personal history of radiation therapy    Past Surgical History:  Procedure Laterality Date   BREAST BIOPSY Right 01/23/2018   stereo calcs  x clip, DUCTAL CARCINOMA IN SITU, INTERMEDIATE GRADE, WITHOUT COMEDONECROSIS   BREAST EXCISIONAL BIOPSY     BREAST LUMPECTOMY Right 05/02/2018   with radiation   BREAST LUMPECTOMY WITH NEEDLE LOCALIZATION AND AXILLARY SENTINEL LYMPH NODE BX Right 05/02/2018   Procedure:  BREAST LUMPECTOMY WITH NEEDLE LOCALIZATION AND POSSIBLE SENTINEL NODE BX;  Surgeon: Sung Amabile, DO;  Location: [facility] ORS;  Service: General;  Laterality: Right;   TUBAL LIGATION     Social History:   reports that she has never smoked. She has never used smokeless tobacco. She reports current alcohol use. She reports that she does not use drugs.  Family History  Problem Relation Age of Onset   Non-Hodgkin's lymphoma Mother    Heart attack Father    Hypertension Brother    Breast cancer Neg Hx    Colon cancer Neg Hx     Medications: Patient's Medications  New Prescriptions   No medications on file  Previous Medications   ANASTROZOLE (ARIMIDEX) 1 MG TABLET    Take 1 tablet (1 mg total) by mouth daily.   BIOTIN 5 MG TBDP    Take 5,000 mcg by mouth daily.   CALCIUM CITRATE-VITAMIN D (CITRACAL+D) 315-200 MG-UNIT TABLET    Take by mouth.   CHOLECALCIFEROL 25 MCG (1000 UT) TABLET    Take 1,000 Units by mouth daily.   CYANOCOBALAMIN 1000 MCG TABLET    Take 2,500 mcg by mouth.    MAGNESIUM 250 MG TABS    Take 250 mg by mouth at bedtime as needed.    NAPROXEN SODIUM (ALEVE) 220 MG TABLET    Take by mouth.   SIMVASTATIN (ZOCOR) 40 MG TABLET  Take 40 mg by mouth every evening.    VITAMIN C (ASCORBIC ACID) 250 MG TABLET    Take 250 mg by mouth daily.  Modified Medications   No medications on file  Discontinued Medications   MUPIROCIN OINTMENT (BACTROBAN) 2 %    Apply 1 application topically daily. Apply daily to area behind left knee and cover with band aid    Physical Exam:  Vitals:   09/09/22 0951  BP: 134/74  Pulse: 71  Temp: (!) 97.3 F (36.3 C)  SpO2: 98%  Weight: 121 lb (54.9 kg)  Height: 5\' 2"  (1.575 m)   Body mass index is 22.13 kg/m. Wt Readings from Last 3 Encounters:  09/09/22 121 lb (54.9 kg)  06/14/22 119 lb 8 oz (54.2 kg)  12/31/21 131 lb 6.4 oz (59.6 kg)    Physical Exam Constitutional:      Appearance: Normal appearance.  HENT:     Right Ear: There is  impacted cerumen.  Pulmonary:     Effort: Pulmonary effort is normal.  Neurological:     Mental Status: She is alert. Mental status is at baseline.  Psychiatric:        Mood and Affect: Mood normal.     Labs reviewed: Basic Metabolic Panel: Recent Labs    12/14/21 1344 06/14/22 1338  NA 138 138  K 4.2 3.9  CL 103 104  CO2 28 27  GLUCOSE 95 87  BUN 23 21  CREATININE 0.90 0.87  CALCIUM 9.7 9.6   Liver Function Tests: Recent Labs    12/14/21 1344 06/14/22 1338  AST 19 25  ALT 15 20  ALKPHOS 53 54  BILITOT 0.5 0.6  PROT 6.7 7.1  ALBUMIN 4.0 4.2   No results for input(s): "LIPASE", "AMYLASE" in the last 8760 hours. No results for input(s): "AMMONIA" in the last 8760 hours. CBC: Recent Labs    12/14/21 1344 06/14/22 1338  WBC 9.0 6.5  NEUTROABS 6.8 4.4  HGB 13.0 12.7  HCT 38.8 39.3  MCV 94.2 94.7  PLT 323 291   Lipid Panel: No results for input(s): "CHOL", "HDL", "LDLCALC", "TRIG", "CHOLHDL", "LDLDIRECT" in the last 8760 hours. TSH: No results for input(s): "TSH" in the last 8760 hours. A1C: No results found for: "HGBA1C"   Assessment/Plan 1. Impacted cerumen of right ear Flushed with good results. Pt tolerated well.  Emma Aguirre. Emma Aguirre  [facility] [place] [phone]

## 2022-10-04 ENCOUNTER — Ambulatory Visit
Admission: RE | Admit: 2022-10-04 | Discharge: 2022-10-04 | Disposition: A | Discharge status: Home / Self Care | Payer: Medicare HMO | Source: Ambulatory Visit | Attending: Oncology | Admitting: Oncology

## 2022-10-04 DIAGNOSIS — Z1231 Encounter for screening mammogram for malignant neoplasm of breast: Secondary | ICD-10-CM | POA: Insufficient documentation

## 2022-10-04 DIAGNOSIS — D0511 Intraductal carcinoma in situ of right breast: Secondary | ICD-10-CM | POA: Insufficient documentation

## 2022-10-28 ENCOUNTER — Ambulatory Visit: Payer: Medicare HMO | Admitting: Dermatology

## 2022-11-29 ENCOUNTER — Encounter: Payer: Self-pay | Admitting: Dermatology

## 2022-11-29 ENCOUNTER — Ambulatory Visit: Payer: Medicare HMO | Admitting: Dermatology

## 2022-11-29 VITALS — BP 116/65 | HR 79

## 2022-11-29 DIAGNOSIS — L570 Actinic keratosis: Secondary | ICD-10-CM

## 2022-11-29 DIAGNOSIS — L821 Other seborrheic keratosis: Secondary | ICD-10-CM

## 2022-11-29 DIAGNOSIS — L578 Other skin changes due to chronic exposure to nonionizing radiation: Secondary | ICD-10-CM | POA: Diagnosis not present

## 2022-11-29 DIAGNOSIS — W908XXA Exposure to other nonionizing radiation, initial encounter: Secondary | ICD-10-CM

## 2022-11-29 DIAGNOSIS — Z1283 Encounter for screening for malignant neoplasm of skin: Secondary | ICD-10-CM

## 2022-11-29 DIAGNOSIS — L814 Other melanin hyperpigmentation: Secondary | ICD-10-CM | POA: Diagnosis not present

## 2022-11-29 DIAGNOSIS — D229 Melanocytic nevi, unspecified: Secondary | ICD-10-CM

## 2022-11-29 DIAGNOSIS — Z872 Personal history of diseases of the skin and subcutaneous tissue: Secondary | ICD-10-CM

## 2022-11-29 DIAGNOSIS — Z86007 Personal history of in-situ neoplasm of skin: Secondary | ICD-10-CM

## 2022-11-29 DIAGNOSIS — D1801 Hemangioma of skin and subcutaneous tissue: Secondary | ICD-10-CM

## 2022-11-29 DIAGNOSIS — Z85828 Personal history of other malignant neoplasm of skin: Secondary | ICD-10-CM

## 2022-11-29 DIAGNOSIS — B351 Tinea unguium: Secondary | ICD-10-CM

## 2022-11-29 NOTE — Progress Notes (Signed)
Follow-Up Visit   Subjective  Emma Aguirre is a 87 y.o. female who presents for the following: Skin Cancer Screening and Full Body Skin Exam. Hx BCC, SSCIS, AKs  Irritated skin lesion on the neck, patient would like checked today. Hx of tinea unguium, pt doesn't want to use Terbinafine anymore. Has used pulse dosing in 2022, 2023, and Terbinafine 250 mg po QD x 1 mth in 2024.  The patient presents for Total-Body Skin Exam (TBSE) for skin cancer screening and mole check. The patient has spots, moles and lesions to be evaluated, some may be new or changing and the patient may have concern these could be cancer.  The following portions of the chart were reviewed this encounter and updated as appropriate: medications, allergies, medical history  Review of Systems:  No other skin or systemic complaints except as noted in HPI or Assessment and Plan.  Objective  Well appearing patient in no apparent distress; mood and affect are within normal limits.  A full examination was performed including scalp, head, eyes, ears, nose, lips, neck, chest, axillae, abdomen, back, buttocks, bilateral upper extremities, bilateral lower extremities, hands, feet, fingers, toes, fingernails, and toenails. All findings within normal limits unless otherwise noted below.   Relevant physical exam findings are noted in the Assessment and Plan.  R cheek x 1 Erythematous thin papules/macules with gritty scale.     Assessment & Plan   SKIN CANCER SCREENING PERFORMED TODAY.  ACTINIC DAMAGE - Chronic condition, secondary to cumulative UV/sun exposure - diffuse scaly erythematous macules with underlying dyspigmentation - Recommend daily broad spectrum sunscreen SPF 30+ to sun-exposed areas, reapply every 2 hours as needed.  - Staying in the shade or wearing long sleeves, sun glasses (UVA+UVB protection) and wide brim hats (4-inch brim around the entire circumference of the hat) are also recommended for sun  protection.  - Call for new or changing lesions.  LENTIGINES, SEBORRHEIC KERATOSES, HEMANGIOMAS - Benign normal skin lesions - Benign-appearing - Call for any changes  MELANOCYTIC NEVI - Tan-brown and/or pink-flesh-colored symmetric macules and papules - Benign appearing on exam today - Observation - Call clinic for new or changing moles - Recommend daily use of broad spectrum spf 30+ sunscreen to sun-exposed areas.   HISTORY OF BASAL CELL CARCINOMA OF THE SKIN - numerous of the face and legs, about 15 years ago, not treated by our office - No evidence of recurrence today - Recommend regular full body skin exams - Recommend daily broad spectrum sunscreen SPF 30+ to sun-exposed areas, reapply every 2 hours as needed.  - Call if any new or changing lesions are noted between office visits  HISTORY OF SQUAMOUS CELL CARCINOMA IN SITU OF THE SKIN - L popliteal fossa, tx with ED&C on 10/02/20 - No evidence of recurrence today - Recommend regular full body skin exams - Recommend daily broad spectrum sunscreen SPF 30+ to sun-exposed areas, reapply every 2 hours as needed.  - Call if any new or changing lesions are noted between office visits  Neck clear today for irregular skin lesions, patient notes previous irritated lesion which has resolved.  Actinic keratosis R cheek x 1  Actinic keratoses are precancerous spots that appear secondary to cumulative UV radiation exposure/sun exposure over time. They are chronic with expected duration over 1 year. A portion of actinic keratoses will progress to squamous cell carcinoma of the skin. It is not possible to reliably predict which spots will progress to skin cancer and so treatment is recommended  to prevent development of skin cancer.  Recommend daily broad spectrum sunscreen SPF 30+ to sun-exposed areas, reapply every 2 hours as needed.  Recommend staying in the shade or wearing long sleeves, sun glasses (UVA+UVB protection) and wide brim hats  (4-inch brim around the entire circumference of the hat). Call for new or changing lesions.  Destruction of lesion - R cheek x 1 Complexity: simple   Destruction method: cryotherapy   Informed consent: discussed and consent obtained   Timeout:  patient name, date of birth, surgical site, and procedure verified Lesion destroyed using liquid nitrogen: Yes   Region frozen until ice ball extended beyond lesion: Yes   Outcome: patient tolerated procedure well with no complications   Post-procedure details: wound care instructions given    ONYCHOMYCOSIS Exam: Thickened toenails with subungal debris c/w onychomycosis  Chronic and persistent condition with duration or expected duration over one year. Condition is symptomatic/ bothersome to patient. Not currently at goal.  Treatment Plan: Patient defers oral Lamisil due to possible s/e.   Return in about 1 year (around 11/29/2023) for TBSE - Hx BCC, AKs.  I, Cari Caraway, CMA, am acting as scribe for Elie Goody, MD .  Documentation: I have reviewed the above documentation for accuracy and completeness, and I agree with the above.  Elie Goody, MD

## 2022-11-29 NOTE — Patient Instructions (Signed)

## 2022-12-15 ENCOUNTER — Ambulatory Visit: Payer: Medicare HMO | Admitting: Oncology

## 2022-12-15 ENCOUNTER — Other Ambulatory Visit: Payer: Medicare HMO

## 2022-12-15 ENCOUNTER — Ambulatory Visit: Payer: Medicare HMO

## 2022-12-20 ENCOUNTER — Inpatient Hospital Stay: Payer: Medicare HMO

## 2022-12-20 ENCOUNTER — Encounter: Payer: Self-pay | Admitting: Oncology

## 2022-12-20 ENCOUNTER — Inpatient Hospital Stay: Payer: Medicare HMO | Attending: Oncology

## 2022-12-20 ENCOUNTER — Inpatient Hospital Stay (HOSPITAL_BASED_OUTPATIENT_CLINIC_OR_DEPARTMENT_OTHER): Payer: Medicare HMO | Admitting: Oncology

## 2022-12-20 VITALS — BP 145/62 | HR 61

## 2022-12-20 VITALS — BP 118/56 | HR 64 | Temp 97.9°F | Resp 18 | Wt 122.7 lb

## 2022-12-20 DIAGNOSIS — D0511 Intraductal carcinoma in situ of right breast: Secondary | ICD-10-CM | POA: Diagnosis present

## 2022-12-20 DIAGNOSIS — Z23 Encounter for immunization: Secondary | ICD-10-CM | POA: Insufficient documentation

## 2022-12-20 DIAGNOSIS — Z79811 Long term (current) use of aromatase inhibitors: Secondary | ICD-10-CM | POA: Insufficient documentation

## 2022-12-20 DIAGNOSIS — M810 Age-related osteoporosis without current pathological fracture: Secondary | ICD-10-CM | POA: Insufficient documentation

## 2022-12-20 LAB — CMP (CANCER CENTER ONLY)
ALT: 20 U/L (ref 0–44)
AST: 21 U/L (ref 15–41)
Albumin: 3.7 g/dL (ref 3.5–5.0)
Alkaline Phosphatase: 54 U/L (ref 38–126)
Anion gap: 7 (ref 5–15)
BUN: 23 mg/dL (ref 8–23)
CO2: 25 mmol/L (ref 22–32)
Calcium: 8.8 mg/dL — ABNORMAL LOW (ref 8.9–10.3)
Chloride: 103 mmol/L (ref 98–111)
Creatinine: 0.8 mg/dL (ref 0.44–1.00)
GFR, Estimated: >60 mL/min (ref >60)
Glucose, Bld: 99 mg/dL (ref 70–99)
Potassium: 3.8 mmol/L (ref 3.5–5.1)
Sodium: 135 mmol/L (ref 135–145)
Total Bilirubin: 0.3 mg/dL (ref 0.3–1.2)
Total Protein: 6 g/dL — ABNORMAL LOW (ref 6.5–8.1)

## 2022-12-20 LAB — CBC WITH DIFFERENTIAL (CANCER CENTER ONLY)
Abs Immature Granulocytes: 0.02 10*3/uL (ref 0.00–0.07)
Basophils Absolute: 0.1 10*3/uL (ref 0.0–0.1)
Basophils Relative: 1 %
Eosinophils Absolute: 0.2 10*3/uL (ref 0.0–0.5)
Eosinophils Relative: 4 %
HCT: 36.5 % (ref 36.0–46.0)
Hemoglobin: 11.8 g/dL — ABNORMAL LOW (ref 12.0–15.0)
Immature Granulocytes: 0 %
Lymphocytes Relative: 20 %
Lymphs Abs: 1 10*3/uL (ref 0.7–4.0)
MCH: 31.1 pg (ref 26.0–34.0)
MCHC: 32.3 g/dL (ref 30.0–36.0)
MCV: 96.3 fL (ref 80.0–100.0)
Monocytes Absolute: 0.6 10*3/uL (ref 0.1–1.0)
Monocytes Relative: 11 %
Neutro Abs: 3.3 10*3/uL (ref 1.7–7.7)
Neutrophils Relative %: 64 %
Platelet Count: 258 10*3/uL (ref 150–400)
RBC: 3.79 MIL/uL — ABNORMAL LOW (ref 3.87–5.11)
RDW: 12.6 % (ref 11.5–15.5)
WBC Count: 5.2 10*3/uL (ref 4.0–10.5)
nRBC: 0 % (ref 0.0–0.2)

## 2022-12-20 MED ORDER — INFLUENZA VAC A&B SURF ANT ADJ 0.5 ML IM SUSY
0.5000 mL | PREFILLED_SYRINGE | Freq: Once | INTRAMUSCULAR | Status: AC
  Administered 2022-12-20: 0.5 mL via INTRAMUSCULAR
  Filled 2022-12-20: qty 0.5

## 2022-12-20 MED ORDER — SODIUM CHLORIDE 0.9 % IV SOLN
Freq: Once | INTRAVENOUS | Status: AC
  Filled 2022-12-20: qty 250

## 2022-12-20 MED ORDER — ZOLEDRONIC ACID 4 MG/5ML IV CONC
3.3000 mg | Freq: Once | INTRAVENOUS | Status: AC
  Administered 2022-12-20: 3.3 mg via INTRAVENOUS
  Filled 2022-12-20: qty 4.13

## 2022-12-20 NOTE — Assessment & Plan Note (Signed)
Osteoporosis, in the context of aromatase inhibitor use. 06/24/2021, bone density results reviewed. Continue calcium and vitamin D supplementation  Proceed with Zometa every 6 months

## 2022-12-20 NOTE — Assessment & Plan Note (Signed)
History of DCIS Labs are reviewed and discussed with patient. Continue Arimidex 1 mg a day.-Plan 5 years, till April 2025 10/05/2021, screening mammogram bilaterally showed no mammographic evidence of malignancy. Repeat mammogram in July 2024

## 2022-12-20 NOTE — Progress Notes (Signed)
Hematology/Oncology Progress note [phone] [phone]    REASON FOR VISIT:  Follow-up and management for DCIS.  ASSESSMENT & PLAN:   Ductal carcinoma in situ (DCIS) of right breast History of DCIS Labs are reviewed and discussed with patient. Continue Arimidex 1 mg a day.-Plan 5 years, till April 2025 10/05/2021, screening mammogram bilaterally showed no mammographic evidence of malignancy. Repeat mammogram in July 2024  Osteoporosis Osteoporosis, in the context of aromatase inhibitor use. 06/24/2021, bone density results reviewed. Continue calcium and vitamin D supplementation  Proceed with Zometa every 6 months  Orders Placed This Encounter  Procedures   CBC with Differential (Cancer Center Only)    Standing Status:   Future    Standing Expiration Date:   12/20/2023   CMP (Cancer Center only)    Standing Status:   Future    Standing Expiration Date:   12/20/2023   Follow  up in 6 months.  All questions were answered. The patient knows to call the clinic with any problems, questions or concerns.  Rickard Patience, MD, PhD [facility] Hematology Oncology 12/20/2022   HISTORY OF PRESENTING ILLNESS:  Emma Aguirre is a  87 y.o.  female with PMH listed below who was referred to me for evaluation of DCIS.  Patient is a retired Char[employer] fundraiser.  Patient had diagnostic mammogram right, on 01/13/2018 to follow up right upper quadrant calcifications.  Mammogram showed right upper quadrant 6mm group of calcification, for which stereotactic core needle biopsy was performed.   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    Family history: denies any breast cancer or ovarian cancer OCP use: report remote use of OCP Estrogen and progesterone therapy: endorses history of hormone replacement therapy History of radiation to chest: denies.  Denies any breast pain, nipple discharge, breast skin  changes.  #Treatment - She underwent lumpectomy of right breast on 05/02/2018. 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  Clip and biopsy site present.  Background fibrocystic changes.  No evidence of invasive carcinoma.  Size of DCIS at least 5 mm.  Margins uninvolved by DCIS.  Closest margin 6 mm. pTis(DCIS) pNx.  Hormone receptor studies were performed on previous biopsy specimen. ER> 90%, PR 51 to 90% -March 2020 finished adjuvant radiation. -April 2020 started on Arimidex 1 mg daily. -02/06/2019 DEXA at [facility] showed Osteoporosis on Zometa every 6 months.  Discussed about other options about switching to tamoxifen and patient prefers to stay on aromatase inhibitor.   Patient takes biotin and hair loss has improved.  INTERVAL HISTORY Emma Aguirre is a 87 y.o. female who has above history reviewed by me today presents for follow up visit for management of DCIS, status post lumpectomy 05/02/2018 Patient takes Arimidex.  She tolerates well.  Manageable side effects. Denies any breast concerns or bone pain.  Patient denies any new breast concerns.  Review of Systems  Constitutional:  Negative for chills, fever, malaise/fatigue and weight loss.  HENT:  Negative for nosebleeds and sore throat.   Eyes:  Negative for double vision, photophobia and redness.  Respiratory:  Negative for cough, shortness of breath and wheezing.   Cardiovascular:  Negative for chest pain, palpitations, orthopnea and leg swelling.  Gastrointestinal:  Negative for abdominal pain, blood in stool, nausea and vomiting.  Genitourinary:  Negative for dysuria.  Musculoskeletal:  Positive for joint pain. Negative for back pain and neck pain.  Skin:  Negative for itching and rash.  Neurological:  Negative  for dizziness, tingling and tremors.  Endo/Heme/Allergies:  Negative for environmental allergies. Does not bruise/bleed easily.  Psychiatric/Behavioral:  Negative for depression and hallucinations.      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   left popliteal fossa, EDC 10/02/20   Hyperlipidemia    Personal history of radiation therapy     SURGICAL HISTORY: Past Surgical History:  Procedure Laterality Date   BREAST BIOPSY Right 01/23/2018   stereo calcs  x clip, DUCTAL CARCINOMA IN SITU, INTERMEDIATE GRADE, WITHOUT COMEDONECROSIS   BREAST EXCISIONAL BIOPSY     BREAST LUMPECTOMY Right 05/02/2018   with radiation   BREAST LUMPECTOMY WITH NEEDLE LOCALIZATION AND AXILLARY SENTINEL LYMPH NODE BX Right 05/02/2018   Procedure: BREAST LUMPECTOMY WITH NEEDLE LOCALIZATION AND POSSIBLE SENTINEL NODE BX;  Surgeon: Sung Amabile, DO;  Location: [facility] ORS;  Service: General;  Laterality: Right;   TUBAL LIGATION      SOCIAL HISTORY: Social History   Socioeconomic History   Marital status: Married    Spouse name: Not on file   Number of children: Not on file   Years of education: Not on file   Highest education level: Associate degree: academic program  Occupational History   Not on file  Tobacco Use   Smoking status: Never   Smokeless tobacco: Never  Vaping Use   Vaping status: Never Used  Substance and Sexual Activity   Alcohol use: Yes    Comment: 1 GLASS WINE A WEEK    Drug use: Never   Sexual activity: Not on file  Other Topics Concern   Not on file  Social History Narrative   Not on file   Social Determinants of Health   Financial Resource Strain: Low Risk  (12/09/2022)   Received from [facility]   Overall Financial Resource Strain (CARDIA)    Difficulty of Paying Living Expenses:  Not hard at all  Food Insecurity: No Food Insecurity (12/09/2022)   [facility] from [facility]   Hunger Vital Sign    Worried About Running Out of Food in the Last Year: Never true    Ran Out of Food in the Last Year: Never true  Transportation Needs: No Transportation Needs (12/09/2022)   Received [facility] - Transportation    In the past 12 months, has lack of transportation kept you from medical appointments or from getting medications?: No    Lack of Transportation (Non-Medical): No  Physical Activity: Sufficiently Active (09/07/2022)   Exercise Vital Sign    Days of Exercise per Week: 5 days    Minutes of Exercise per Session: 60 min  Stress: No Stress Concern Present (09/07/2022)   [employer] of Occupational Health - Occupational Stress Questionnaire    Feeling of Stress : Not at all  Social Connections: Socially Integrated (09/07/2022)   Social Connection and Isolation Panel [NHANES]    Frequency of Communication with Friends and Family: More than three times a week    Frequency of Social Gatherings with Friends and  Family: Twice a week    Attends Religious Services: More than 4 times per year    Active Member of Clubs or Organizations: Yes    Attends Engineer, structural: More than 4 times per year    Marital Status: Married  Catering manager Violence: Not on file    FAMILY HISTORY: Family History  Problem Relation Age of Onset   Non-Hodgkin's lymphoma Mother    Heart attack Father    Hypertension Brother    Breast cancer Neg Hx    Colon cancer Neg Hx     ALLERGIES:  is allergic to wasp venom protein.  MEDICATIONS:  Current Outpatient Medications  Medication Sig Dispense Refill   anastrozole (ARIMIDEX) 1 MG tablet Take 1 tablet (1 mg total) by mouth daily. 90 tablet 3   Biotin 5 MG TBDP Take 5,000 mcg by mouth daily.     calcium citrate-vitamin D (CITRACAL+D) 315-200 MG-UNIT tablet Take by mouth.      Cholecalciferol 25 MCG (1000 UT) tablet Take 1,000 Units by mouth daily.     cyanocobalamin 1000 MCG tablet Take 2,500 mcg by mouth.      Magnesium 250 MG TABS Take 250 mg by mouth at bedtime as needed.      naproxen sodium (ALEVE) 220 MG tablet Take by mouth.     simvastatin (ZOCOR) 40 MG tablet Take 40 mg by mouth every evening.      vitamin C (ASCORBIC ACID) 250 MG tablet Take 250 mg by mouth daily.     No current facility-administered medications for this visit.     PHYSICAL EXAMINATION: ECOG PERFORMANCE STATUS: 1 - Symptomatic but completely ambulatory Vitals:   12/20/22 1053  BP: (!) 118/56  Pulse: 64  Resp: 18  Temp: 97.9 F (36.6 C)   Filed Weights   12/20/22 1053  Weight: 122 lb 11.2 oz (55.7 kg)    Physical Exam Constitutional:      General: She is not in acute distress. HENT:     Head: Normocephalic and atraumatic.  Eyes:     General: No scleral icterus.    Pupils: Pupils are equal, round, and reactive to light.  Cardiovascular:     Rate and Rhythm: Normal rate and regular rhythm.     Heart sounds: Normal heart sounds.  Pulmonary:     Effort: Pulmonary effort is normal. No respiratory distress.     Breath sounds: No wheezing.  Abdominal:     General: Bowel sounds are normal.     Palpations: Abdomen is soft.  Musculoskeletal:        General: No deformity. Normal range of motion.     Cervical back: Normal range of motion and neck supple.  Skin:    General: Skin is warm and dry.     Findings: No erythema or rash.  Neurological:     Mental Status: She is alert and oriented to person, place, and time. Mental status is at baseline.     Cranial Nerves: No cranial nerve deficit.     Coordination: Coordination normal.  Psychiatric:        Mood and Affect: Mood normal.      LABORATORY DATA:  I have reviewed the data as listed    Latest Ref Rng & Units 12/20/2022   10:25 AM 06/14/2022    1:38 PM 12/14/2021    1:44 PM  CBC  WBC 4.0 - 10.5 K/uL 5.2  6.5   9.0   Hemoglobin 12.0 - 15.0 g/dL  11.8  12.7  13.0   Hematocrit 36.0 - 46.0 % 36.5  39.3  38.8   Platelets 150 - 400 K/uL 258  291  323       Latest Ref Rng & Units 12/20/2022   10:24 AM 06/14/2022    1:38 PM 12/14/2021    1:44 PM  CMP  Glucose 70 - 99 mg/dL 99  87  95   BUN 8 - 23 mg/dL 23  21  23    Creatinine 0.44 - 1.00 mg/dL 1.61  0.96  0.45   Sodium 135 - 145 mmol/L 135  138  138   Potassium 3.5 - 5.1 mmol/L 3.8  3.9  4.2   Chloride 98 - 111 mmol/L 103  104  103   CO2 22 - 32 mmol/L 25  27  28    Calcium 8.9 - 10.3 mg/dL 8.8  9.6  9.7   Total Protein 6.5 - 8.1 g/dL 6.0  7.1  6.7   Total Bilirubin 0.3 - 1.2 mg/dL 0.3  0.6  0.5   Alkaline Phos 38 - 126 U/L 54  54  53   AST 15 - 41 U/L 21  25  19    ALT 0 - 44 U/L 20  20  15       Iron/TIBC/Ferritin/ %Sat No results found for: "IRON", "TIBC", "FERRITIN", "IRONPCTSAT"

## 2022-12-20 NOTE — Patient Instructions (Signed)

## 2022-12-20 NOTE — Progress Notes (Signed)
Pt here for follow up. No new concerns voiced.   

## 2023-05-21 ENCOUNTER — Encounter: Payer: Self-pay | Admitting: Oncology

## 2023-05-30 ENCOUNTER — Encounter: Payer: Self-pay | Admitting: Oncology

## 2023-06-20 ENCOUNTER — Inpatient Hospital Stay: Payer: Medicare HMO

## 2023-06-20 ENCOUNTER — Encounter: Payer: Self-pay | Admitting: Oncology

## 2023-06-20 ENCOUNTER — Inpatient Hospital Stay: Payer: Medicare HMO | Admitting: Oncology

## 2023-06-20 ENCOUNTER — Inpatient Hospital Stay: Payer: Medicare HMO | Attending: Oncology

## 2023-06-20 VITALS — BP 134/61 | HR 59 | Temp 96.0°F | Resp 16

## 2023-06-20 VITALS — BP 126/64 | HR 64 | Temp 97.3°F | Resp 18 | Wt 118.5 lb

## 2023-06-20 DIAGNOSIS — Z79811 Long term (current) use of aromatase inhibitors: Secondary | ICD-10-CM | POA: Diagnosis not present

## 2023-06-20 DIAGNOSIS — D0511 Intraductal carcinoma in situ of right breast: Secondary | ICD-10-CM | POA: Insufficient documentation

## 2023-06-20 DIAGNOSIS — M810 Age-related osteoporosis without current pathological fracture: Secondary | ICD-10-CM

## 2023-06-20 DIAGNOSIS — Z807 Family history of other malignant neoplasms of lymphoid, hematopoietic and related tissues: Secondary | ICD-10-CM | POA: Insufficient documentation

## 2023-06-20 DIAGNOSIS — M818 Other osteoporosis without current pathological fracture: Secondary | ICD-10-CM | POA: Diagnosis not present

## 2023-06-20 LAB — CBC WITH DIFFERENTIAL (CANCER CENTER ONLY)
Abs Immature Granulocytes: 0.03 10*3/uL (ref 0.00–0.07)
Basophils Absolute: 0.1 10*3/uL (ref 0.0–0.1)
Basophils Relative: 1 %
Eosinophils Absolute: 0.4 10*3/uL (ref 0.0–0.5)
Eosinophils Relative: 6 %
HCT: 39.3 % (ref 36.0–46.0)
Hemoglobin: 12.5 g/dL (ref 12.0–15.0)
Immature Granulocytes: 1 %
Lymphocytes Relative: 18 %
Lymphs Abs: 1.2 10*3/uL (ref 0.7–4.0)
MCH: 30.9 pg (ref 26.0–34.0)
MCHC: 31.8 g/dL (ref 30.0–36.0)
MCV: 97.3 fL (ref 80.0–100.0)
Monocytes Absolute: 0.8 10*3/uL (ref 0.1–1.0)
Monocytes Relative: 13 %
Neutro Abs: 4 10*3/uL (ref 1.7–7.7)
Neutrophils Relative %: 61 %
Platelet Count: 339 10*3/uL (ref 150–400)
RBC: 4.04 MIL/uL (ref 3.87–5.11)
RDW: 12.8 % (ref 11.5–15.5)
WBC Count: 6.5 10*3/uL (ref 4.0–10.5)
nRBC: 0 % (ref 0.0–0.2)

## 2023-06-20 LAB — CMP (CANCER CENTER ONLY)
ALT: 15 U/L (ref 0–44)
AST: 18 U/L (ref 15–41)
Albumin: 3.8 g/dL (ref 3.5–5.0)
Alkaline Phosphatase: 50 U/L (ref 38–126)
Anion gap: 6 (ref 5–15)
BUN: 26 mg/dL — ABNORMAL HIGH (ref 8–23)
CO2: 28 mmol/L (ref 22–32)
Calcium: 9 mg/dL (ref 8.9–10.3)
Chloride: 102 mmol/L (ref 98–111)
Creatinine: 0.87 mg/dL (ref 0.44–1.00)
GFR, Estimated: >60 mL/min (ref >60)
Glucose, Bld: 93 mg/dL (ref 70–99)
Potassium: 4.3 mmol/L (ref 3.5–5.1)
Sodium: 136 mmol/L (ref 135–145)
Total Bilirubin: 0.5 mg/dL (ref 0.0–1.2)
Total Protein: 6.3 g/dL — ABNORMAL LOW (ref 6.5–8.1)

## 2023-06-20 MED ORDER — ZOLEDRONIC ACID 4 MG/5ML IV CONC
3.3000 mg | Freq: Once | INTRAVENOUS | Status: AC
  Administered 2023-06-20: 3.3 mg via INTRAVENOUS
  Filled 2023-06-20: qty 4.13

## 2023-06-20 NOTE — Assessment & Plan Note (Addendum)
 History of DCIS Labs are reviewed and discussed with patient. Continue Arimidex 1 mg a day.-Plan 5 years, till April 2025 10/05/2021, screening mammogram bilaterally showed no mammographic evidence of malignancy. Repeat mammogram in July 2025

## 2023-06-20 NOTE — Patient Instructions (Signed)

## 2023-06-20 NOTE — Progress Notes (Signed)
 Hematology/Oncology Progress note [phone] [phone]    REASON FOR VISIT:  Follow-up and management for DCIS.  ASSESSMENT & PLAN:   Ductal carcinoma in situ (DCIS) of right breast History of DCIS Labs are reviewed and discussed with patient. Continue Arimidex 1 mg a day.-Plan 5 years, till April 2025 10/05/2021, screening mammogram bilaterally showed no mammographic evidence of malignancy. Repeat mammogram in July 2025  Osteoporosis Osteoporosis, in the context of aromatase inhibitor use. 06/24/2021, bone density results reviewed. Continue calcium and vitamin D supplementation  Proceed with Zometa every 6 months  Orders Placed This Encounter  Procedures   MM 3D SCREENING MAMMOGRAM BILATERAL BREAST    Standing Status:   Future    Expected Date:   10/20/2023    Expiration Date:   06/19/2024    Reason for Exam (SYMPTOM  OR DIAGNOSIS REQUIRED):   DCIS    Preferred imaging location?:   [facility]   CMP (Cancer Center only)    Standing Status:   Future    Expected Date:   06/19/2024    Expiration Date:   06/19/2024   CBC with Differential (Cancer Center Only)    Standing Status:   Future    Expected Date:   06/19/2024    Expiration Date:   06/19/2024   Basic Metabolic Panel - Cancer Center Only    Standing Status:   Future    Expected Date:   12/21/2023    Expiration Date:   06/19/2024   Follow  up per LOS All questions were answered. The patient knows to call the clinic with any problems, questions or concerns.  Rickard Patience, MD, PhD [facility] Hematology Oncology 06/20/2023   HISTORY OF PRESENTING ILLNESS:  Emma Aguirre is a  88 y.o.  female with PMH listed below who was referred to me for evaluation of DCIS.  Patient is a retired Char[employer] fundraiser.  Patient had diagnostic mammogram right, on 01/13/2018 to follow up right upper quadrant calcifications.  Mammogram showed right upper quadrant 6mm group of calcification, for which stereotactic core needle biopsy was  performed.   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    Family history: denies any breast cancer or ovarian cancer OCP use: report remote use of OCP Estrogen and progesterone therapy: endorses history of hormone replacement therapy History of radiation to chest: denies.  Denies any breast pain, nipple discharge, breast skin changes.  #Treatment - She underwent lumpectomy of right breast on 05/02/2018. 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  Clip and biopsy site present.  Background fibrocystic changes.  No evidence of invasive carcinoma.  Size of DCIS at least 5 mm.  Margins uninvolved by DCIS.  Closest margin 6 mm. pTis(DCIS) pNx.  Hormone receptor studies were performed on previous biopsy specimen. ER> 90%, PR 51 to 90% -March 2020 finished adjuvant radiation. -April 2020 started on Arimidex 1 mg daily. -02/06/2019 DEXA at [facility] showed Osteoporosis on Zometa every 6 months.  Discussed about other options about switching to tamoxifen and patient prefers to stay on aromatase inhibitor.   Patient takes biotin and hair loss has improved.  INTERVAL HISTORY Emma Aguirre is a 88 y.o. female who has above history reviewed by me today presents for follow up visit for management of DCIS, status post lumpectomy 05/02/2018 Patient stopped taking Arimidex  3 weeks ago.   Denies any breast concerns or bone pain.  Patient denies any new breast concerns.  Review of Systems  Constitutional:  Negative for chills, fever, malaise/fatigue and weight loss.  HENT:  Negative for nosebleeds and sore throat.   Eyes:  Negative for double vision, photophobia and redness.  Respiratory:  Negative for cough, shortness of breath and wheezing.   Cardiovascular:  Negative for chest pain, palpitations, orthopnea and leg swelling.  Gastrointestinal:  Negative for abdominal pain, blood in stool, nausea and vomiting.   Genitourinary:  Negative for dysuria.  Musculoskeletal:  Positive for joint pain. Negative for back pain and neck pain.  Skin:  Negative for itching and rash.  Neurological:  Negative for dizziness, tingling and tremors.  Endo/Heme/Allergies:  Negative for environmental allergies. Does not bruise/bleed easily.  Psychiatric/Behavioral:  Negative for depression and hallucinations.     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   left popliteal fossa, EDC 10/02/20   Hyperlipidemia    Personal history of radiation therapy     SURGICAL HISTORY: Past Surgical History:  Procedure Laterality Date   BREAST BIOPSY Right 01/23/2018   stereo calcs  x clip, DUCTAL CARCINOMA IN SITU, INTERMEDIATE GRADE, WITHOUT COMEDONECROSIS   BREAST EXCISIONAL BIOPSY     BREAST LUMPECTOMY Right 05/02/2018   with radiation   BREAST LUMPECTOMY WITH NEEDLE LOCALIZATION AND AXILLARY SENTINEL LYMPH NODE BX Right 05/02/2018   Procedure: BREAST LUMPECTOMY WITH NEEDLE LOCALIZATION AND POSSIBLE SENTINEL NODE BX;  Surgeon: Sung Amabile, DO;  Location: [facility] ORS;  Service: General;  Laterality: Right;   TUBAL LIGATION      SOCIAL HISTORY: Social History   Socioeconomic History   Marital status: Married    Spouse name: Not on file   Number of children: Not on file   Years of education: Not on file   Highest education level: Associate degree: academic program  Occupational History   Not on file  Tobacco Use   Smoking status: Never   Smokeless tobacco: Never  Vaping Use   Vaping status: Never Used  Substance and Sexual Activity   Alcohol use:  Yes    Comment: 1 GLASS WINE A WEEK    Drug use: Never   Sexual activity: Not on file  Other Topics Concern   Not on file  Social History Narrative   Not on file   Social Drivers of Health   Financial Resource Strain: Low Risk  (01/18/2023)   [facility] from [facility]   Overall Financial Resource Strain (CARDIA)    Difficulty of Paying Living Expenses: Not hard at all  Food Insecurity: No Food Insecurity (01/18/2023)   [facility]   Hunger Vital Sign    Worried About Running Out of Food in the Last Year: Never true    Ran Out of Food in the Last Year: Never true  Transportation Needs: No Transportation Needs (01/18/2023)   Received from [facility] - Transportation    In the past 12 months, has lack of transportation kept you from medical appointments or from getting medications?: No    Lack of Transportation (Non-Medical): No  Physical Activity: Sufficiently Active (09/07/2022)   Exercise Vital Sign    Days of Exercise per Week: 5 days    Minutes of Exercise per Session: 60 min  Stress: No Stress Concern Present (09/07/2022)   [employer] of Occupational Health - Occupational Stress Questionnaire    Feeling of Stress : Not at all  Social Connections: Socially Integrated (09/07/2022)   Social Connection and Isolation Panel [NHANES]    Frequency of Communication with Friends and Family: More than three times a week    Frequency of Social Gatherings with Friends and Family: Twice a week    Attends Religious Services: More than 4 times per year    Active Member of [employer] or Organizations: Yes    Attends Engineer, structural: More than 4 times per year    Marital Status: Married  Catering manager Violence: Not on file    FAMILY HISTORY: Family History  Problem Relation Age of Onset   Non-Hodgkin's lymphoma Mother    Heart attack Father    Hypertension Brother    Breast cancer Neg Hx     Colon cancer Neg Hx     ALLERGIES:  is allergic to wasp venom protein.  MEDICATIONS:  Current Outpatient Medications  Medication Sig Dispense Refill   Biotin 5 MG TBDP Take 5,000 mcg by mouth daily.     calcium citrate-vitamin D (CITRACAL+D) 315-200 MG-UNIT tablet Take by mouth.     Cholecalciferol 25 MCG (1000 UT) tablet Take 1,000 Units by mouth daily.     cyanocobalamin 1000 MCG tablet Take 2,500 mcg by mouth.      Magnesium 250 MG TABS Take 250 mg by mouth at bedtime as needed.      simvastatin (ZOCOR) 40 MG tablet Take 40 mg by mouth every evening.      vitamin C (ASCORBIC ACID) 250 MG tablet Take 250 mg by mouth daily.     anastrozole (ARIMIDEX) 1 MG tablet Take 1 tablet (1 mg total) by mouth daily. (Patient not taking: Reported on 06/20/2023) 90 tablet 3   naproxen sodium (ALEVE) 220 MG tablet Take by mouth. (Patient not taking: Reported on 06/20/2023)     No current facility-administered medications for this visit.     PHYSICAL EXAMINATION: ECOG PERFORMANCE STATUS: 1 - Symptomatic but completely ambulatory Vitals:   06/20/23 1109  BP: 126/64  Pulse: 64  Resp: 18  Temp: (!) 97.3 F (36.3 C)   Filed Weights   06/20/23 1109  Weight: 118 lb 8 oz (53.8 kg)    Physical Exam Constitutional:      General: She is not in acute distress. HENT:     Head: Normocephalic and atraumatic.  Eyes:     General: No scleral icterus.    Pupils: Pupils are equal, round, and reactive to light.  Cardiovascular:     Rate and Rhythm: Normal rate and regular rhythm.  Pulmonary:     Effort: Pulmonary effort is normal. No respiratory distress.     Breath sounds: No wheezing.  Abdominal:     General: There is no distension.  Musculoskeletal:        General: Normal range of motion.     Cervical back: Normal range of motion and neck supple.  Skin:    Findings: No erythema or rash.  Neurological:     Mental Status: She is alert and oriented to person, place, and time. Mental status is at  baseline.  Psychiatric:        Mood  and Affect: Mood normal.      LABORATORY DATA:  I have reviewed the data as listed    Latest Ref Rng & Units 06/20/2023   10:56 AM 12/20/2022   10:25 AM 06/14/2022    1:38 PM  CBC  WBC 4.0 - 10.5 K/uL 6.5  5.2  6.5   Hemoglobin 12.0 - 15.0 g/dL 95.2  84.1  32.4   Hematocrit 36.0 - 46.0 % 39.3  36.5  39.3   Platelets 150 - 400 K/uL 339  258  291       Latest Ref Rng & Units 06/20/2023   10:56 AM 12/20/2022   10:24 AM 06/14/2022    1:38 PM  CMP  Glucose 70 - 99 mg/dL 93  99  87   BUN 8 - 23 mg/dL 26  23  21    Creatinine 0.44 - 1.00 mg/dL 4.01  0.27  2.53   Sodium 135 - 145 mmol/L 136  135  138   Potassium 3.5 - 5.1 mmol/L 4.3  3.8  3.9   Chloride 98 - 111 mmol/L 102  103  104   CO2 22 - 32 mmol/L 28  25  27    Calcium 8.9 - 10.3 mg/dL 9.0  8.8  9.6   Total Protein 6.5 - 8.1 g/dL 6.3  6.0  7.1   Total Bilirubin 0.0 - 1.2 mg/dL 0.5  0.3  0.6   Alkaline Phos 38 - 126 U/L 50  54  54   AST 15 - 41 U/L 18  21  25    ALT 0 - 44 U/L 15  20  20       Iron/TIBC/Ferritin/ %Sat No results found for: "IRON", "TIBC", "FERRITIN", "IRONPCTSAT"

## 2023-06-20 NOTE — Assessment & Plan Note (Signed)
Osteoporosis, in the context of aromatase inhibitor use. 06/24/2021, bone density results reviewed. Continue calcium and vitamin D supplementation  Proceed with Zometa every 6 months

## 2023-06-20 NOTE — Progress Notes (Signed)
 Pt here for follow up. Reports that she took last dose of Anastrazole approx 3 weeks ago because it would have been 5 years.

## 2023-10-05 ENCOUNTER — Ambulatory Visit
Admission: RE | Admit: 2023-10-05 | Discharge: 2023-10-05 | Disposition: A | Discharge status: Home / Self Care | Source: Ambulatory Visit | Attending: Oncology | Admitting: Oncology

## 2023-10-05 DIAGNOSIS — Z853 Personal history of malignant neoplasm of breast: Secondary | ICD-10-CM | POA: Insufficient documentation

## 2023-10-05 DIAGNOSIS — D0511 Intraductal carcinoma in situ of right breast: Secondary | ICD-10-CM

## 2023-10-05 DIAGNOSIS — Z1231 Encounter for screening mammogram for malignant neoplasm of breast: Secondary | ICD-10-CM | POA: Diagnosis not present

## 2023-11-28 ENCOUNTER — Encounter: Payer: Medicare HMO | Admitting: Dermatology

## 2023-12-21 ENCOUNTER — Inpatient Hospital Stay

## 2023-12-21 ENCOUNTER — Inpatient Hospital Stay: Attending: Oncology

## 2023-12-21 VITALS — BP 139/66 | HR 61 | Temp 97.6°F | Resp 18

## 2023-12-21 DIAGNOSIS — Z1721 Progesterone receptor positive status: Secondary | ICD-10-CM | POA: Insufficient documentation

## 2023-12-21 DIAGNOSIS — M810 Age-related osteoporosis without current pathological fracture: Secondary | ICD-10-CM

## 2023-12-21 DIAGNOSIS — D0511 Intraductal carcinoma in situ of right breast: Secondary | ICD-10-CM | POA: Insufficient documentation

## 2023-12-21 DIAGNOSIS — Z79811 Long term (current) use of aromatase inhibitors: Secondary | ICD-10-CM | POA: Insufficient documentation

## 2023-12-21 DIAGNOSIS — Z170 Estrogen receptor positive status [ER+]: Secondary | ICD-10-CM | POA: Diagnosis not present

## 2023-12-21 LAB — BASIC METABOLIC PANEL - CANCER CENTER ONLY
Anion gap: 8 (ref 5–15)
BUN: 23 mg/dL (ref 8–23)
CO2: 26 mmol/L (ref 22–32)
Calcium: 9.2 mg/dL (ref 8.9–10.3)
Chloride: 101 mmol/L (ref 98–111)
Creatinine: 0.79 mg/dL (ref 0.44–1.00)
GFR, Estimated: >60 mL/min (ref >60)
Glucose, Bld: 109 mg/dL — ABNORMAL HIGH (ref 70–99)
Potassium: 4 mmol/L (ref 3.5–5.1)
Sodium: 135 mmol/L (ref 135–145)

## 2023-12-21 MED ORDER — ZOLEDRONIC ACID 4 MG/5ML IV CONC
3.3000 mg | Freq: Once | INTRAVENOUS | Status: AC
  Administered 2023-12-21: 3.3 mg via INTRAVENOUS
  Filled 2023-12-21: qty 4.13

## 2023-12-21 MED ORDER — SODIUM CHLORIDE 0.9 % IV SOLN
Freq: Once | INTRAVENOUS | Status: AC
  Filled 2023-12-21: qty 250

## 2023-12-26 ENCOUNTER — Telehealth: Payer: Self-pay | Admitting: Radiation Oncology

## 2023-12-26 NOTE — Telephone Encounter (Signed)
 error

## 2024-06-19 ENCOUNTER — Ambulatory Visit

## 2024-06-19 ENCOUNTER — Ambulatory Visit: Admitting: Oncology

## 2024-06-19 ENCOUNTER — Other Ambulatory Visit
